# Patient Record
Sex: Female | Born: 1990 | Race: Black or African American | Hispanic: No | Marital: Single
Health system: Southern US, Community
[De-identification: ages and names within clinical notes are randomized; demographics above are authoritative.]

## PROBLEM LIST (undated history)

## (undated) HISTORY — PX: INCISION AND DRAINAGE OF WOUND: SHX1803

---
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
## 2010-10-22 NOTE — Op Note (Signed)
NAME:  Baik, Jerolyn             ACCOUNT NO.:  [id number]   MEDICAL RECORD NO.:  [id number]          PATIENT TYPE:  AMB   LOCATION:  DSC                          FACILITY:  [facility]   PHYSICIAN:  Prabhakar D. Pendse, M.D.DATE OF BIRTH:  03-May-1991   DATE OF PROCEDURE:  03/05/2004  DATE OF DISCHARGE:                                 OPERATIVE REPORT   PREOPERATIVE DIAGNOSES:  1.  Abscess, left abdominal wall.  2.  Two boils of left axilla.   POSTOPERATIVE DIAGNOSES:  1.  Abscess, left abdominal wall.  2.  Two boils of left axilla.   OPERATION PERFORMED:  Three infected lesions, #1 -- left abdominal wall and  #2 and #3 -- left axilla.   SURGEON:  Prabhakar D. Pendse, M.D.   ANESTHESIA:  Nurse.   OPERATIVE PROCEDURE:  Under satisfactory general anesthesia, patient in  supine position, left abdominal wall and left axilla regions were thoroughly  prepped and draped in the usual manner.  __________  11 blade, left axilla  boils were opened, drained, irrigated and dressed with Neosporin.  Left  abdominal wall large abscess was opened with 11 blade, debrided, irrigated,  packed with Iodoform gauze, appropriate dressing applied.  Throughout the  procedure, the patient's vital signs remained stable.  The patient withstood  the procedure well and was transferred to recovery room in satisfactory  general condition.       PDP/MEDQ  D:  03/05/2004  T:  03/06/2004  Job:  [id number]   cc:   [facility]

## 2011-04-12 ENCOUNTER — Encounter: Payer: Self-pay | Admitting: *Deleted

## 2011-04-12 ENCOUNTER — Emergency Department (HOSPITAL_COMMUNITY)
Admission: EM | Admit: 2011-04-12 | Discharge: 2011-04-13 | Discharge status: Against Medical Advice | Payer: Medicaid Other | Attending: Emergency Medicine | Admitting: Emergency Medicine

## 2011-04-12 DIAGNOSIS — R059 Cough, unspecified: Secondary | ICD-10-CM | POA: Insufficient documentation

## 2011-04-12 DIAGNOSIS — R05 Cough: Secondary | ICD-10-CM | POA: Insufficient documentation

## 2011-04-12 NOTE — ED Notes (Signed)
Difficulty breathing today. She has a history of asthma and she has had a productive cough

## 2011-04-13 ENCOUNTER — Encounter (HOSPITAL_COMMUNITY): Payer: Self-pay

## 2011-04-13 ENCOUNTER — Emergency Department (HOSPITAL_COMMUNITY)
Admission: EM | Admit: 2011-04-13 | Discharge: 2011-04-13 | Disposition: A | Discharge status: Home / Self Care | Payer: Medicaid Other | Attending: Emergency Medicine | Admitting: Emergency Medicine

## 2011-04-13 DIAGNOSIS — R0602 Shortness of breath: Secondary | ICD-10-CM | POA: Insufficient documentation

## 2011-04-13 DIAGNOSIS — J45909 Unspecified asthma, uncomplicated: Secondary | ICD-10-CM | POA: Insufficient documentation

## 2011-04-13 MED ORDER — ALBUTEROL SULFATE HFA 108 (90 BASE) MCG/ACT IN AERS: 2.0000 | INHALATION_SPRAY | RESPIRATORY_TRACT | Status: DC | PRN

## 2011-04-13 NOTE — ED Notes (Signed)
Patient ambulatory to room, in no apparent distress.  Lungs CTA . Mother sts that patient had audible wheezing earlier tonight and used all of her rescue inhaler,

## 2011-04-13 NOTE — ED Provider Notes (Signed)
History     CSN: [id number] Arrival date & time: 04/13/2011  3:37 AM   First MD Initiated Contact with Patient 04/13/11 0448      Chief Complaint  Patient presents with  . Shortness of Breath    (Consider location/radiation/quality/duration/timing/severity/associated sxs/prior treatment) Patient is a 20 y.o. female presenting with shortness of breath. The history is provided by the patient and a parent.  Shortness of Breath  The current episode started today. The problem has been resolved. Associated symptoms include shortness of breath. Pertinent negatives include no chest pain, no fever and no sore throat.  pt w hx asthma felt sob/wheezy earlier. Had run out of inhaler. No sob or wheezing currently. No chest pain. Denies cough. No fever or chills. No prior admits related to asthma.   Past Medical History  Diagnosis Date  . Asthma     History reviewed. No pertinent past surgical history.  History reviewed. No pertinent family history.  History  Substance Use Topics  . Smoking status: Never Smoker   . Smokeless tobacco: Not on file  . Alcohol Use: Yes    OB History    Grav Para Term Preterm Abortions TAB SAB Ect Mult Living                  Review of Systems  Constitutional: Negative for fever and chills.  HENT: Negative for sore throat.   Respiratory: Positive for shortness of breath.   Cardiovascular: Negative for chest pain, palpitations and leg swelling.  Gastrointestinal: Negative for abdominal pain.  Skin: Negative for rash.  Neurological: Negative for headaches.    Allergies  Zyrtec  Home Medications   Current Outpatient Rx  Name Route Sig Dispense Refill  . ALBUTEROL SULFATE HFA 108 (90 BASE) MCG/ACT IN AERS Inhalation Inhale 2 puffs into the lungs every 6 (six) hours as needed. For wheezing     . IBUPROFEN 200 MG PO TABS Oral Take 200 mg by mouth every 6 (six) hours as needed. Headaches       BP 131/75  Pulse 100  Temp(Src) 97.6 F (36.4 C)  (Oral)  Resp 18  SpO2 100%  LMP 04/04/2011  Physical Exam  Nursing note and vitals reviewed. Constitutional: She is oriented to person, place, and time. She appears well-developed and well-nourished. No distress.  HENT:  Nose: Nose normal.  Mouth/Throat: Oropharynx is clear and moist.  Eyes: Conjunctivae are normal. No scleral icterus.  Neck: Neck supple. No tracheal deviation present.  Cardiovascular: Normal rate, regular rhythm, normal heart sounds and intact distal pulses.  Exam reveals no friction rub.   No murmur heard. Pulmonary/Chest: Effort normal and breath sounds normal. No respiratory distress. She has no wheezes. She has no rales.  Abdominal: Normal appearance.  Musculoskeletal: She exhibits no edema and no tenderness.  Neurological: She is alert and oriented to person, place, and time.  Skin: Skin is warm and dry. No rash noted.  Psychiatric: She has a normal mood and affect.    ED Course  Procedures (including critical care time)  Labs Reviewed - No data to display No results found.   No diagnosis found.    MDM  Pt denies any c/o currently x states is hungry. Will give rx for alb mdi.        Suzi Roots, MD 04/13/11 [phone]

## 2011-04-13 NOTE — ED Notes (Signed)
Mom states that pt has been complaining of a headache and was having trouble breathing earlier tonight, pt is out of her inhaler

## 2011-10-22 ENCOUNTER — Encounter (HOSPITAL_COMMUNITY): Payer: Self-pay | Admitting: Emergency Medicine

## 2011-10-22 ENCOUNTER — Emergency Department (HOSPITAL_COMMUNITY)
Admission: EM | Admit: 2011-10-22 | Discharge: 2011-10-22 | Disposition: A | Discharge status: Home / Self Care | Payer: Medicaid Other | Attending: Emergency Medicine | Admitting: Emergency Medicine

## 2011-10-22 DIAGNOSIS — R0609 Other forms of dyspnea: Secondary | ICD-10-CM | POA: Insufficient documentation

## 2011-10-22 DIAGNOSIS — R0989 Other specified symptoms and signs involving the circulatory and respiratory systems: Secondary | ICD-10-CM | POA: Insufficient documentation

## 2011-10-22 DIAGNOSIS — J45901 Unspecified asthma with (acute) exacerbation: Secondary | ICD-10-CM | POA: Insufficient documentation

## 2011-10-22 MED ORDER — PREDNISONE 50 MG PO TABS: 50.0000 mg | ORAL_TABLET | Freq: Every day | ORAL | Status: AC

## 2011-10-22 MED ORDER — PREDNISONE 50 MG PO TABS: 50.0000 mg | ORAL_TABLET | ORAL | Status: DC

## 2011-10-22 MED ORDER — ALBUTEROL SULFATE (5 MG/ML) 0.5% IN NEBU
5.0000 mg | INHALATION_SOLUTION | Freq: Once | RESPIRATORY_TRACT | Status: AC
  Administered 2011-10-22: 5 mg via RESPIRATORY_TRACT
  Filled 2011-10-22: qty 1

## 2011-10-22 MED ORDER — ALBUTEROL SULFATE (5 MG/ML) 0.5% IN NEBU
5.0000 mg | INHALATION_SOLUTION | Freq: Once | RESPIRATORY_TRACT | Status: AC
  Administered 2011-10-22: 5 mg via RESPIRATORY_TRACT

## 2011-10-22 MED ORDER — ALBUTEROL SULFATE HFA 108 (90 BASE) MCG/ACT IN AERS
2.0000 | INHALATION_SPRAY | Freq: Four times a day (QID) | RESPIRATORY_TRACT | Status: DC
  Administered 2011-10-22: 2 via RESPIRATORY_TRACT
  Filled 2011-10-22: qty 6.7

## 2011-10-22 MED ORDER — ALBUTEROL SULFATE (5 MG/ML) 0.5% IN NEBU
INHALATION_SOLUTION | RESPIRATORY_TRACT | Status: AC
  Administered 2011-10-22: 5 mg via RESPIRATORY_TRACT
  Filled 2011-10-22: qty 1

## 2011-10-22 MED ORDER — PREDNISONE 20 MG PO TABS
50.0000 mg | ORAL_TABLET | ORAL | Status: AC
  Administered 2011-10-22: 50 mg via ORAL
  Filled 2011-10-22: qty 3

## 2011-10-22 NOTE — ED Provider Notes (Signed)
History    This chart was scribed for Gerhard Munch, MD, MD by Smitty Pluck. The patient was seen in room STRE1 and the patient's care was started at 5:06PM.   CSN: [id number]  Arrival date & time 10/22/11  1630   None     Chief Complaint  Patient presents with  . Asthma    (Consider location/radiation/quality/duration/timing/severity/associated sxs/prior treatment) The history is provided by the patient.   Jillian Ramsey is a 21 y.o. female who presents to the Emergency Department complaining of moderate asthma onset today. Pt reports that she was wheezing and having difficulty breathing. She reports that she is out medication for asthma. She reports that she has seasonal allergies. Denies any other pain. Denies hx of medical illness. Denies vomiting, diarrhea, fever and cough.   Past Medical History  Diagnosis Date  . Asthma     History reviewed. No pertinent past surgical history.  History reviewed. No pertinent family history.  History  Substance Use Topics  . Smoking status: Never Smoker   . Smokeless tobacco: Not on file  . Alcohol Use: Yes    OB History    Grav Para Term Preterm Abortions TAB SAB Ect Mult Living                  Review of Systems  Constitutional:       HPI  HENT:       HPI otherwise negative  Eyes: Negative.   Respiratory:       HPI, otherwise negative  Cardiovascular:       HPI, otherwise nmegative  Gastrointestinal: Negative for vomiting.  Genitourinary:       HPI, otherwise negative  Musculoskeletal:       HPI, otherwise negative  Skin: Negative.   Neurological: Negative for syncope.    Allergies  Zyrtec  Home Medications   Current Outpatient Rx  Name Route Sig Dispense Refill  . ALBUTEROL SULFATE HFA 108 (90 BASE) MCG/ACT IN AERS Inhalation Inhale 2 puffs into the lungs every 6 (six) hours as needed. For wheezing     . IBUPROFEN 200 MG PO TABS Oral Take 200 mg by mouth every 6 (six) hours as needed. Headaches        BP 153/92  Pulse 113  Temp(Src) 98.3 F (36.8 C) (Oral)  Resp 28  SpO2 100%  LMP 10/18/2011  Physical Exam  Nursing note and vitals reviewed. Constitutional: She is oriented to person, place, and time. She appears well-developed and well-nourished. No distress.  HENT:  Head: Normocephalic and atraumatic.  Eyes: EOM are normal. Pupils are equal, round, and reactive to light.  Neck: Neck supple. No tracheal deviation present.  Cardiovascular: Normal rate.   Pulmonary/Chest: Effort normal. No respiratory distress.  Musculoskeletal: Normal range of motion.  Neurological: She is alert and oriented to person, place, and time.  Skin: Skin is warm and dry.  Psychiatric: She has a normal mood and affect. Her behavior is normal.    ED Course  Procedures (including critical care time) DIAGNOSTIC STUDIES: Oxygen Saturation is 100% on room air, normal by my interpretation.    COORDINATION OF CARE: 5:08PM EDP discusses pt ED treatment with pt.  5:30PM EDP ordered medication: albuterol, proventil, deltasone   Labs Reviewed - No data to display No results found.   No diagnosis found.    MDM  I personally performed the services described in this documentation, which was scribed in my presence. The recorded information has been reviewed and  considered.  This young female with history of asthma now presents with concerns of dyspnea.  By the time of my exam she has already received one albuterol treatment.  Patient is in no distress with minimal wheezing audible on exam.  Patient had no other concerning signs of overt infection, or impending decompensation.  She was provided an albuterol inhaler, and started on a short course of steroids.  Return precautions, follow up instructions were provided.    Gerhard Munch, MD 10/22/11 [phone]

## 2011-10-22 NOTE — ED Notes (Signed)
Patient complaining of asthma attack and difficulty breathing; symptoms started today.  Patient reports being out of medications for her asthma.  To start albuterol treatment in triage.  Breaths equal, but shallow.

## 2011-10-22 NOTE — ED Notes (Signed)
Patient finished with albuterol treatment; states, "I feel a little better".  Will continue to monitor.

## 2011-10-22 NOTE — Discharge Instructions (Signed)
Asthma Attack Prevention HOW CAN ASTHMA BE PREVENTED? Currently, there is no way to prevent asthma from starting. However, you can take steps to control the disease and prevent its symptoms after you have been diagnosed. Learn about your asthma and how to control it. Take an active role to control your asthma by working with your caregiver to create and follow an asthma action plan. An asthma action plan guides you in taking your medicines properly, avoiding factors that make your asthma worse, tracking your level of asthma control, responding to worsening asthma, and seeking emergency care when needed. To track your asthma, keep records of your symptoms, check your peak flow number using a peak flow meter (handheld device that shows how well air moves out of your lungs), and get regular asthma checkups.  Other ways to prevent asthma attacks include:  Use medicines as your caregiver directs.   Identify and avoid things that make your asthma worse (as much as you can).   Keep track of your asthma symptoms and level of control.   Get regular checkups for your asthma.   With your caregiver, write a detailed plan for taking medicines and managing an asthma attack. Then be sure to follow your action plan. Asthma is an ongoing condition that needs regular monitoring and treatment.   Identify and avoid asthma triggers. A number of outdoor allergens and irritants (pollen, mold, cold air, air pollution) can trigger asthma attacks. Find out what causes or makes your asthma worse, and take steps to avoid those triggers (see below).   Monitor your breathing. Learn to recognize warning signs of an attack, such as slight coughing, wheezing or shortness of breath. However, your lung function may already decrease before you notice any signs or symptoms, so regularly measure and record your peak airflow with a home peak flow meter.   Identify and treat attacks early. If you act quickly, you're less likely to have  a severe attack. You will also need less medicine to control your symptoms. When your peak flow measurements decrease and alert you to an upcoming attack, take your medicine as instructed, and immediately stop any activity that may have triggered the attack. If your symptoms do not improve, get medical help.   Pay attention to increasing quick-relief inhaler use. If you find yourself relying on your quick-relief inhaler (such as albuterol), your asthma is not under control. See your caregiver about adjusting your treatment.  IDENTIFY AND CONTROL FACTORS THAT MAKE YOUR ASTHMA WORSE A number of common things can set off or make your asthma symptoms worse (asthma triggers). Keep track of your asthma symptoms for several weeks, detailing all the environmental and emotional factors that are linked with your asthma. When you have an asthma attack, go back to your asthma diary to see which factor, or combination of factors, might have contributed to it. Once you know what these factors are, you can take steps to control many of them.  Allergies: If you have allergies and asthma, it is important to take asthma prevention steps at home. Asthma attacks (worsening of asthma symptoms) can be triggered by allergies, which can cause temporary increased inflammation of your airways. Minimizing contact with the substance to which you are allergic will help prevent an asthma attack. Animal Dander:   Some people are allergic to the flakes of skin or dried saliva from animals with fur or feathers. Keep these pets out of your home.   If you can't keep a pet outdoors, keep the   pet out of your bedroom and other sleeping areas at all times, and keep the door closed.   Remove carpets and furniture covered with cloth from your home. If that is not possible, keep the pet away from fabric-covered furniture and carpets.  Dust Mites:  Many people with asthma are allergic to dust mites. Dust mites are tiny bugs that are found in  every home, in mattresses, pillows, carpets, fabric-covered furniture, bedcovers, clothes, stuffed toys, fabric, and other fabric-covered items.   Cover your mattress in a special dust-proof cover.   Cover your pillow in a special dust-proof cover, or wash the pillow each week in hot water. Water must be hotter than 130 F to kill dust mites. Cold or warm water used with detergent and bleach can also be effective.   Wash the sheets and blankets on your bed each week in hot water.   Try not to sleep or lie on cloth-covered cushions.   Call ahead when traveling and ask for a smoke-free hotel room. Bring your own bedding and pillows, in case the hotel only supplies feather pillows and down comforters, which may contain dust mites and cause asthma symptoms.   Remove carpets from your bedroom and those laid on concrete, if you can.   Keep stuffed toys out of the bed, or wash the toys weekly in hot water or cooler water with detergent and bleach.  Cockroaches:  Many people with asthma are allergic to the droppings and remains of cockroaches.   Keep food and garbage in closed containers. Never leave food out.   Use poison baits, traps, powders, gels, or paste (for example, boric acid).   If a spray is used to kill cockroaches, stay out of the room until the odor goes away.  Indoor Mold:  Fix leaky faucets, pipes, or other sources of water that have mold around them.   Clean moldy surfaces with a cleaner that has bleach in it.  Pollen and Outdoor Mold:  When pollen or mold spore counts are high, try to keep your windows closed.   Stay indoors with windows closed from late morning to afternoon, if you can. Pollen and some mold spore counts are highest at that time.   Ask your caregiver whether you need to take or increase anti-inflammatory medicine before your allergy season starts.  Irritants:   Tobacco smoke is an irritant. If you smoke, ask your caregiver how you can quit. Ask family  members to quit smoking, too. Do not allow smoking in your home or car.   If possible, do not use a wood-burning stove, kerosene heater, or fireplace. Minimize exposure to all sources of smoke, including incense, candles, fires, and fireworks.   Try to stay away from strong odors and sprays, such as perfume, talcum powder, hair spray, and paints.   Decrease humidity in your home and use an indoor air cleaning device. Reduce indoor humidity to below 60 percent. Dehumidifiers or central air conditioners can do this.   Try to have someone else vacuum for you once or twice a week, if you can. Stay out of rooms while they are being vacuumed and for a short while afterward.   If you vacuum, use a dust mask from a hardware store, a double-layered or microfilter vacuum cleaner bag, or a vacuum cleaner with a HEPA filter.   Sulfites in foods and beverages can be irritants. Do not drink beer or wine, or eat dried fruit, processed potatoes, or shrimp if they cause asthma   symptoms.   Cold air can trigger an asthma attack. Cover your nose and mouth with a scarf on cold or windy days.   Several health conditions can make asthma more difficult to manage, including runny nose, sinus infections, reflux disease, psychological stress, and sleep apnea. Your caregiver will treat these conditions, as well.   Avoid close contact with people who have a cold or the flu, since your asthma symptoms may get worse if you catch the infection from them. Wash your hands thoroughly after touching items that may have been handled by people with a respiratory infection.   Get a flu shot every year to protect against the flu virus, which often makes asthma worse for days or weeks. Also get a pneumonia shot once every five to 10 years.  Drugs:  Aspirin and other painkillers can cause asthma attacks. 10% to 20% of people with asthma have sensitivity to aspirin or a group of painkillers called non-steroidal anti-inflammatory drugs  (NSAIDS), such as ibuprofen and naproxen. These drugs are used to treat pain and reduce fevers. Asthma attacks caused by any of these medicines can be severe and even fatal. These drugs must be avoided in people who have known aspirin sensitive asthma. Products with acetaminophen are considered safe for people who have asthma. It is important that people with aspirin sensitivity read labels of all over-the-counter drugs used to treat pain, colds, coughs, and fever.   Beta blockers and ACE inhibitors are other drugs which you should discuss with your caregiver, in relation to your asthma.  ALLERGY SKIN TESTING  Ask your asthma caregiver about allergy skin testing or blood testing (RAST test) to identify the allergens to which you are sensitive. If you are found to have allergies, allergy shots (immunotherapy) for asthma may help prevent future allergies and asthma. With allergy shots, small doses of allergens (substances to which you are allergic) are injected under your skin on a regular schedule. Over a period of time, your body may become used to the allergen and less responsive with asthma symptoms. You can also take measures to minimize your exposure to those allergens. EXERCISE  If you have exercise-induced asthma, or are planning vigorous exercise, or exercise in cold, humid, or dry environments, prevent exercise-induced asthma by following your caregiver's advice regarding asthma treatment before exercising. Document Released: 05/11/2009 Document Revised: 05/12/2011 Document Reviewed: 05/11/2009 [employer] Patient Information 2012 [employer]. 

## 2011-10-28 ENCOUNTER — Ambulatory Visit (INDEPENDENT_AMBULATORY_CARE_PROVIDER_SITE_OTHER): Payer: Medicaid Other | Admitting: Family Medicine

## 2011-10-28 ENCOUNTER — Encounter: Payer: Self-pay | Admitting: Family Medicine

## 2011-10-28 VITALS — BP 122/84 | HR 72 | Temp 97.3°F | Ht 62.0 in | Wt 87.5 lb

## 2011-10-28 DIAGNOSIS — IMO0002 Reserved for concepts with insufficient information to code with codable children: Secondary | ICD-10-CM

## 2011-10-28 DIAGNOSIS — J4520 Mild intermittent asthma, uncomplicated: Secondary | ICD-10-CM

## 2011-10-28 DIAGNOSIS — J45909 Unspecified asthma, uncomplicated: Secondary | ICD-10-CM

## 2011-10-28 MED ORDER — ALBUTEROL SULFATE HFA 108 (90 BASE) MCG/ACT IN AERS: 2.0000 | INHALATION_SPRAY | Freq: Four times a day (QID) | RESPIRATORY_TRACT | Status: DC | PRN

## 2011-10-28 MED ORDER — BECLOMETHASONE DIPROPIONATE 40 MCG/ACT IN AERS: 1.0000 | INHALATION_SPRAY | Freq: Two times a day (BID) | RESPIRATORY_TRACT | Status: DC

## 2011-10-28 NOTE — Patient Instructions (Signed)
Nice to meet you. You should use QVAR twice daily every day. You should use albuterol inhaler only when needed for shortness of breath or wheezing.  Asthma Prevention Cigarette smoke, house dust, molds, pollens, animal dander, certain insects, exercise, and even cold air are all triggers that can cause an asthma attack. Often, no specific triggers are identified.  Take the following measures around your house to reduce attacks:  Avoid cigarette and other smoke. No smoking should be allowed in a home where someone with asthma lives. If smoking is allowed indoors, it should be done in a room with a closed door, and a window should be opened to clear the air. If possible, do not use a wood-burning stove, kerosene heater, or fireplace. Minimize exposure to all sources of smoke, including incense, candles, fires, and fireworks.   Decrease pollen exposure. Keep your windows shut and use central air during the pollen allergy season. Stay indoors with windows closed from late morning to afternoon, if you can. Avoid mowing the lawn if you have grass pollen allergy. Change your clothes and shower after being outside during this time of year.   Remove molds from bathrooms and wet areas. Do this by cleaning the floors with a fungicide or diluted bleach. Avoid using humidifiers, vaporizers, or swamp coolers. These can spread molds through the air. Fix leaky faucets, pipes, or other sources of water that have mold around them.   Decrease house dust exposure. Do this by using bare floors, vacuuming frequently, and changing furnace and air cooler filters frequently. Avoid using feather, wool, or foam bedding. Use polyester pillows and plastic covers over your mattress. Wash bedding weekly in hot water (hotter than 130 F).   Try to get someone else to vacuum for you once or twice a week, if you can. Stay out of rooms while they are being vacuumed and for a short while afterward. If you vacuum, use a dust mask (from  a hardware store), a double-layered or microfilter vacuum cleaner bag, or a vacuum cleaner with a HEPA filter.   Avoid perfumes, talcum powder, hair spray, paints and other strong odors and fumes.   Keep warm-blooded pets (cats, dogs, rodents, birds) outside the home if they are triggers for asthma. If you can't keep the pet outdoors, keep the pet out of your bedroom and other sleeping areas at all times, and keep the door closed. Remove carpets and furniture covered with cloth from your home. If that is not possible, keep the pet away from fabric-covered furniture and carpets.   Eliminate cockroaches. Keep food and garbage in closed containers. Never leave food out. Use poison baits, traps, powders, gels, or paste (for example, boric acid). If a spray is used to kill cockroaches, stay out of the room until the odor goes away.   Decrease indoor humidity to less than 60%. Use an indoor air cleaning device.   Avoid sulfites in foods and beverages. Do not drink beer or wine or eat dried fruit, processed potatoes, or shrimp if they cause asthma symptoms.   Avoid cold air. Cover your nose and mouth with a scarf on cold or windy days.   Avoid aspirin. This is the most common drug causing serious asthma attacks.   If exercise triggers your asthma, ask your caregiver how you should prepare before exercising. (For example, ask if you could use your inhaler 10 minutes before exercising.)   Avoid close contact with people who have a cold or the flu since your asthma  symptoms may get worse if you catch the infection from them. Wash your hands thoroughly after touching items that may have been handled by others with a respiratory infection.   Get a flu shot every year to protect against the flu virus, which often makes asthma worse for days to weeks. Also get a pneumonia shot once every five to 10 years.  Call your caregiver if you want further information about measures you can take to help prevent asthma  attacks. Document Released: 05/23/2005 Document Revised: 05/12/2011 Document Reviewed: 03/31/2009 [facility] Patient Information 2012 [place], [place].

## 2011-10-28 NOTE — Progress Notes (Signed)
  Subjective:    Patient ID: Jillian Ramsey, female    DOB: 1991/03/28, 20 y.o.   MRN: [id number]  HPI new patient to establish care.  1. asthma attack. Patient was seen in the ER one week ago and treated for asthma attack with 5 days of prednisone and albuterol. She was discharged from the hospital. At that time her wheezing and shortness of breath have improved. She continues to use albuterol 1-2 times daily.  Has a history of asthma as a child requiring one hospitalization, no history of intubations. Patient states she has been asymptomatic for some time until her most recent exacerbation. Mother smokes outside home. Denies current wheezing, dyspnea, cough, fever, palpitations, rash, visual changes.  2. health maintenance. Patient absent and not interested in contraception. Has regular menstrual cycles. Does exercise and eats very diet.  Past Medical History  Diagnosis Date  . Asthma     Past Surgical History  Procedure Date  . Incision and drainage of wound    History   Social History  . Marital Status: Single    Spouse Name: N/A    Number of Children: N/A  . Years of Education: N/A   Occupational History  . Not on file.   Social History Main Topics  . Smoking status: Never Smoker   . Smokeless tobacco: Not on file  . Alcohol Use: Yes  . Drug Use: No  . Sexually Active: No   Other Topics Concern  . Not on file   Social History Narrative   Lives with mother Jillian Ramsey, Has one grown brother. High school graduate. Unemployed.   Family History  Problem Relation Age of Onset  . Hyperlipidemia Mother   . Hypertension Mother   . Asthma Brother   . Cancer Maternal Grandmother 49    pancreatic  . Diabetes Maternal Grandmother   . Heart disease Maternal Grandfather   . Stroke Maternal Grandfather    Review of Systems See history of present illness otherwise negative.    Objective:   Physical Exam  Vitals reviewed. Constitutional: She is oriented to person,  place, and time. She appears well-developed and well-nourished. No distress.  HENT:  Head: Normocephalic and atraumatic.  Mouth/Throat: Oropharynx is clear and moist.  Eyes: EOM are normal. Pupils are equal, round, and reactive to light.  Neck: Neck supple.  Cardiovascular: Normal rate, regular rhythm, normal heart sounds and intact distal pulses.   No murmur heard. Pulmonary/Chest: Effort normal and breath sounds normal. No respiratory distress. She has no wheezes. She has no rales.  Abdominal: Soft.  Musculoskeletal: Normal range of motion. She exhibits no edema and no tenderness.  Neurological: She is alert and oriented to person, place, and time. No cranial nerve deficit. She exhibits normal muscle tone. Coordination normal.  Skin: No rash noted. She is not diaphoretic.  Psychiatric: She has a normal mood and affect.       Assessment & Plan:

## 2011-10-28 NOTE — Assessment & Plan Note (Signed)
Patient is currently requiring albuterol treatments one to 2 times daily even after resolution of symptoms. Her brother has history of severe persistent asthma. Will start patient on low-dose Qvar. Have discussed with patient and her mother the difference between rescue inhaler and maintenance therapy, and I believe they do understand this. I have advised patient and mother to followup if still requiring albuterol more than once weekly. Patient may likely not require inhaled steroid in long-term his symptoms resolve we'll get trial without this. Discussed avoidance of triggers including secondhand smoke.

## 2012-05-07 ENCOUNTER — Telehealth: Payer: Self-pay | Admitting: Family Medicine

## 2012-05-07 NOTE — Telephone Encounter (Signed)
Please notify they need an office visit, looks like has been > 1 year since I have seen her.

## 2012-05-07 NOTE — Telephone Encounter (Signed)
Mother is calling, very confusing, asking for a letter from Dr. Cristal Ford for Franc[place] to be able to receive different services through Medicaid to assist her daughter with being able to function in the real world.

## 2012-05-14 NOTE — Telephone Encounter (Signed)
Please call family to schedule an office visit.

## 2012-06-13 ENCOUNTER — Ambulatory Visit (INDEPENDENT_AMBULATORY_CARE_PROVIDER_SITE_OTHER): Payer: Medicaid Other | Admitting: Family Medicine

## 2012-06-13 ENCOUNTER — Encounter: Payer: Self-pay | Admitting: Family Medicine

## 2012-06-13 ENCOUNTER — Telehealth: Payer: Self-pay | Admitting: Family Medicine

## 2012-06-13 VITALS — BP 110/62 | HR 57 | Temp 98.5°F | Wt 92.7 lb

## 2012-06-13 DIAGNOSIS — J45909 Unspecified asthma, uncomplicated: Secondary | ICD-10-CM

## 2012-06-13 DIAGNOSIS — IMO0002 Reserved for concepts with insufficient information to code with codable children: Secondary | ICD-10-CM | POA: Insufficient documentation

## 2012-06-13 DIAGNOSIS — R4689 Other symptoms and signs involving appearance and behavior: Secondary | ICD-10-CM | POA: Insufficient documentation

## 2012-06-13 DIAGNOSIS — R636 Underweight: Secondary | ICD-10-CM

## 2012-06-13 DIAGNOSIS — F09 Unspecified mental disorder due to known physiological condition: Secondary | ICD-10-CM

## 2012-06-13 DIAGNOSIS — R4189 Other symptoms and signs involving cognitive functions and awareness: Secondary | ICD-10-CM | POA: Insufficient documentation

## 2012-06-13 DIAGNOSIS — J4530 Mild persistent asthma, uncomplicated: Secondary | ICD-10-CM

## 2012-06-13 LAB — CBC WITH DIFFERENTIAL/PLATELET
Eosinophils Absolute: 0.2 10*3/uL (ref 0.0–0.7)
Eosinophils Relative: 3 % (ref 0–5)
HCT: 39 % (ref 36.0–46.0)
Lymphocytes Relative: 32 % (ref 12–46)
Lymphs Abs: 2.3 10*3/uL (ref 0.7–4.0)
MCH: 28.3 pg (ref 26.0–34.0)
MCV: 86.1 fL (ref 78.0–100.0)
Monocytes Absolute: 0.5 10*3/uL (ref 0.1–1.0)
Platelets: 392 10*3/uL (ref 150–400)
RBC: 4.53 MIL/uL (ref 3.87–5.11)
RDW: 13.3 % (ref 11.5–15.5)
WBC: 7.2 10*3/uL (ref 4.0–10.5)

## 2012-06-13 LAB — TSH: TSH: 1.103 u[IU]/mL (ref 0.350–4.500)

## 2012-06-13 LAB — COMPREHENSIVE METABOLIC PANEL
BUN: 10 mg/dL (ref 6–23)
CO2: 27 mEq/L (ref 19–32)
Calcium: 10.3 mg/dL (ref 8.4–10.5)
Chloride: 103 mEq/L (ref 96–112)
Creat: 0.6 mg/dL (ref 0.50–1.10)
Total Bilirubin: 0.7 mg/dL (ref 0.3–1.2)

## 2012-06-13 NOTE — Assessment & Plan Note (Addendum)
Stable, has gained 5 lbs since last year. Good appetite. 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 Check TSH, basic labs to evaluate nutrition status.

## 2012-06-13 NOTE — Patient Instructions (Addendum)
Nice to see you. If your asthma gets worse, come back to clinic. Try to avoid smoking. Eat healthy foods.

## 2012-06-13 NOTE — Telephone Encounter (Signed)
Left message. Called mother to get an idea about what kind of letter she was requesting earlier. I saw Ambera and Karel Jarvis today (transported by [facility] with their day program) and wanted to make sure their needs were met.

## 2012-06-13 NOTE — Assessment & Plan Note (Signed)
Symptoms well controlled. Advised to avoid smoking. Patient refuses flu shot today.

## 2012-06-13 NOTE — Assessment & Plan Note (Signed)
Attempted to call mother to make sure needs being met at Day program ([facility]?). Left message to return call.

## 2012-06-13 NOTE — Telephone Encounter (Signed)
Discussed with mother. She requests letters of medical benefit of there therapies. I will send these to her by mail.

## 2012-06-13 NOTE — Progress Notes (Signed)
  Subjective:    Patient ID: Jillian Ramsey, female    DOB: [birth year], 22 y.o.   MRN: [id number]  HPI pt and her sister transported by [facility] with [facility].   1. Routine f/u asthma. No complaints today. She reports using albuterol inhaler prn on a monthly basis, none recently. No ED or UC visits. States she smokes infrequently, but she gets in trouble and not regular.  Denies dyspnea, cough, fever, wheezing, chest pain, abd pain.   2. Low weight. Good appetite. Stable. Has regular menstrual cycles. Denies pain, fever, diarrhea, severe cramping, chills, night sweats.   Review of Systems See HPI otherwise negative.  reports that she has been smoking Cigarettes.  She has been smoking about .1 packs per day. She does not have any smokeless tobacco history on file.     Objective:   Physical Exam  Constitutional: She is oriented to person, place, and time. She appears well-developed and well-nourished. No distress.       Thin. Smiling appears well.  HENT:  Head: Normocephalic and atraumatic.  Mouth/Throat: No oropharyngeal exudate.  Eyes: EOM are normal.  Neck: Normal range of motion. Neck supple. No thyromegaly present.  Cardiovascular: Normal rate, regular rhythm and normal heart sounds.   No murmur heard. Pulmonary/Chest: Effort normal and breath sounds normal. No respiratory distress. She has no wheezes. She has no rales.  Abdominal: Soft. Bowel sounds are normal. She exhibits no distension. There is no tenderness.  Musculoskeletal: She exhibits no edema.  Neurological: She is alert and oriented to person, place, and time.  Skin: No rash noted. She is not diaphoretic.  Psychiatric: She has a normal mood and affect.       Assessment & Plan:

## 2012-12-07 ENCOUNTER — Encounter (HOSPITAL_COMMUNITY): Payer: Self-pay | Admitting: Emergency Medicine

## 2012-12-07 ENCOUNTER — Emergency Department (HOSPITAL_COMMUNITY)
Admission: EM | Admit: 2012-12-07 | Discharge: 2012-12-07 | Disposition: A | Discharge status: Home / Self Care | Payer: Medicaid Other | Attending: Emergency Medicine | Admitting: Emergency Medicine

## 2012-12-07 DIAGNOSIS — J45909 Unspecified asthma, uncomplicated: Secondary | ICD-10-CM | POA: Insufficient documentation

## 2012-12-07 DIAGNOSIS — F17200 Nicotine dependence, unspecified, uncomplicated: Secondary | ICD-10-CM | POA: Insufficient documentation

## 2012-12-07 DIAGNOSIS — H5789 Other specified disorders of eye and adnexa: Secondary | ICD-10-CM

## 2012-12-07 DIAGNOSIS — H579 Unspecified disorder of eye and adnexa: Secondary | ICD-10-CM | POA: Insufficient documentation

## 2012-12-07 DIAGNOSIS — Z79899 Other long term (current) drug therapy: Secondary | ICD-10-CM | POA: Insufficient documentation

## 2012-12-07 MED ORDER — FLUORESCEIN SODIUM 1 MG OP STRP
1.0000 | ORAL_STRIP | Freq: Once | OPHTHALMIC | Status: AC
  Administered 2012-12-07: 1 via OPHTHALMIC
  Filled 2012-12-07: qty 1

## 2012-12-07 MED ORDER — TETRACAINE HCL 0.5 % OP SOLN
1.0000 [drp] | Freq: Once | OPHTHALMIC | Status: AC
  Administered 2012-12-07: 1 [drp] via OPHTHALMIC
  Filled 2012-12-07: qty 2

## 2012-12-07 NOTE — ED Notes (Signed)
Woke this am with eye irritation, redness and swelling. No known injury.

## 2012-12-07 NOTE — ED Provider Notes (Signed)
History    CSN: [id number] Arrival date & time 12/07/12  0912  First MD Initiated Contact with Patient 12/07/12 0920     Chief Complaint  Patient presents with  . Eye Problem   (Consider location/radiation/quality/duration/timing/severity/associated sxs/prior Treatment) HPI Pt is a 22yo female BIB mother c/o right eye swelling, redness, and itching when she woke up this morning.  Pt states right eye was swollen shut and very itchy.  Mother states right eye was draining clear fluid and swollen.  Pt denies any pain. Denies injury to the eye. Pt does wear glasses but never wears contacts.  Denies ear pain, nasal congestion or drainage, denies fever.  Pt does have regular eye exams.   Past Medical History  Diagnosis Date  . Asthma    Past Surgical History  Procedure Laterality Date  . Incision and drainage of wound     Family History  Problem Relation Age of Onset  . Hyperlipidemia Mother   . Hypertension Mother   . Asthma Brother   . Cancer Maternal Grandmother 49    pancreatic  . Diabetes Maternal Grandmother   . Heart disease Maternal Grandfather   . Stroke Maternal Grandfather    History  Substance Use Topics  . Smoking status: Current Some Day Smoker -- 0.10 packs/day    Types: Cigarettes  . Smokeless tobacco: Not on file  . Alcohol Use: Yes   OB History   Grav Para Term Preterm Abortions TAB SAB Ect Mult Living                 Review of Systems  Constitutional: Negative for fever and chills.  Eyes: Positive for discharge ( clear), redness and itching. Negative for photophobia, pain and visual disturbance.       Right Eye   All other systems reviewed and are negative.    Allergies  Zyrtec  Home Medications   Current Outpatient Rx  Name  Route  Sig  Dispense  Refill  . EXPIRED: albuterol (PROVENTIL HFA) 108 (90 BASE) MCG/ACT inhaler   Inhalation   Inhale 2 puffs into the lungs every 6 (six) hours as needed for wheezing.   1 Inhaler   1   . EXPIRED:  beclomethasone (QVAR) 40 MCG/ACT inhaler   Inhalation   Inhale 1 puff into the lungs 2 (two) times daily.   1 Inhaler   3   . ibuprofen (ADVIL,MOTRIN) 200 MG tablet   Oral   Take 200 mg by mouth every 6 (six) hours as needed. Headaches           BP 121/71  Pulse 72  Temp(Src) 98.1 F (36.7 C) (Oral)  SpO2 100%  LMP 12/01/2012 Physical Exam  Nursing note and vitals reviewed. Constitutional: She appears well-developed and well-nourished. No distress.  Pt appears well, NAD. No obvious deformity or irritation in eyes.   HENT:  Head: Normocephalic and atraumatic.  Right Ear: Hearing, tympanic membrane, external ear and ear canal normal.  Left Ear: Hearing, tympanic membrane, external ear and ear canal normal.  Nose: Nose normal.  Mouth/Throat: Uvula is midline and oropharynx is clear and moist. No oropharyngeal exudate.  Eyes: EOM and lids are normal. Pupils are equal, round, and reactive to light. No foreign bodies found. Right eye exhibits no chemosis, no discharge, no exudate and no hordeolum. No foreign body present in the right eye. Left eye exhibits no chemosis, no discharge, no exudate and no hordeolum. No foreign body present in the left eye. Right  conjunctiva is injected ( mild). Right conjunctiva has no hemorrhage. Left conjunctiva is not injected. Left conjunctiva has no hemorrhage. No scleral icterus. Right eye exhibits normal extraocular motion and no nystagmus. Left eye exhibits normal extraocular motion and no nystagmus.  Slit lamp exam:      The right eye shows no corneal abrasion, no corneal flare, no corneal ulcer, no foreign body, no hyphema, no hypopyon and no fluorescein uptake.    Visual acuity: R-20/30, L-20/30, Bilat-20/-30 with corrective lenses. Mild erythema of right eye.  PERRL. EOM in tact.  Nl slit lamp exam. Fluorescein used. No corneal abrassion or corneal ulcer.   Neck: Normal range of motion. Neck supple.  Cardiovascular: Normal rate, regular rhythm  and normal heart sounds.   Pulmonary/Chest: Effort normal. No respiratory distress. She has no wheezes.  Musculoskeletal: Normal range of motion.  Neurological: She is alert.  Skin: Skin is warm and dry. She is not diaphoretic.  Psychiatric: She has a normal mood and affect. Her behavior is normal.    ED Course  Procedures (including critical care time) Labs Reviewed - No data to display No results found. 1. Irritation of right eye     MDM  R eye irritation, "swollen" per pt.  Right eye appears mildly injected/erythemic. Rest of eye exam NL.  No corneal abrasion or ulcers. Visual acuity obtained, same in both eyes with glasses. Pt may use warm and cool compresses for irritation.  Advised not to continuously rub eye, may make irritation last longer or even worse.  Advised pt to f/u with Dr. Allyne Gee or with her previously established opthalmologist for continued eye irritation. Advised to return if notice increase swelling, pain, crusted thick discharge, fever, or other worrisome symptoms arise. Pt and mother verbalized understanding and agreement with tx plan.  Vitals: unremarkable. Discharged in stable condition.      Junius Finner, PA-C 12/07/12 1010

## 2012-12-07 NOTE — ED Provider Notes (Signed)
Medical screening examination/treatment/procedure(s) were performed by non-physician practitioner and as supervising physician I was immediately available for consultation/collaboration.   Gavin Pound. Oletta Lamas, MD 12/07/12 2227

## 2013-03-16 ENCOUNTER — Encounter (HOSPITAL_COMMUNITY): Payer: Self-pay | Admitting: Emergency Medicine

## 2013-03-16 ENCOUNTER — Emergency Department (HOSPITAL_COMMUNITY): Payer: Medicaid Other

## 2013-03-16 ENCOUNTER — Emergency Department (HOSPITAL_COMMUNITY)
Admission: EM | Admit: 2013-03-16 | Discharge: 2013-03-16 | Disposition: A | Discharge status: Home / Self Care | Payer: Medicaid Other | Attending: Emergency Medicine | Admitting: Emergency Medicine

## 2013-03-16 DIAGNOSIS — Z79899 Other long term (current) drug therapy: Secondary | ICD-10-CM | POA: Insufficient documentation

## 2013-03-16 DIAGNOSIS — J45909 Unspecified asthma, uncomplicated: Secondary | ICD-10-CM

## 2013-03-16 DIAGNOSIS — R000 Tachycardia, unspecified: Secondary | ICD-10-CM | POA: Insufficient documentation

## 2013-03-16 DIAGNOSIS — IMO0002 Reserved for concepts with insufficient information to code with codable children: Secondary | ICD-10-CM | POA: Insufficient documentation

## 2013-03-16 DIAGNOSIS — F17200 Nicotine dependence, unspecified, uncomplicated: Secondary | ICD-10-CM | POA: Insufficient documentation

## 2013-03-16 DIAGNOSIS — J45901 Unspecified asthma with (acute) exacerbation: Secondary | ICD-10-CM | POA: Insufficient documentation

## 2013-03-16 MED ORDER — IPRATROPIUM BROMIDE 0.02 % IN SOLN
0.5000 mg | Freq: Once | RESPIRATORY_TRACT | Status: AC
  Administered 2013-03-16: 0.5 mg via RESPIRATORY_TRACT
  Filled 2013-03-16: qty 2.5

## 2013-03-16 MED ORDER — PREDNISONE 20 MG PO TABS: 40.0000 mg | ORAL_TABLET | Freq: Every day | ORAL | Status: DC

## 2013-03-16 MED ORDER — ALBUTEROL SULFATE (5 MG/ML) 0.5% IN NEBU
5.0000 mg | INHALATION_SOLUTION | Freq: Once | RESPIRATORY_TRACT | Status: AC
  Administered 2013-03-16: 5 mg via RESPIRATORY_TRACT
  Filled 2013-03-16: qty 1

## 2013-03-16 MED ORDER — PREDNISONE 20 MG PO TABS
60.0000 mg | ORAL_TABLET | Freq: Once | ORAL | Status: AC
  Administered 2013-03-16: 60 mg via ORAL
  Filled 2013-03-16: qty 3

## 2013-03-16 MED ORDER — ALBUTEROL SULFATE HFA 108 (90 BASE) MCG/ACT IN AERS
2.0000 | INHALATION_SPRAY | RESPIRATORY_TRACT | Status: DC | PRN
  Filled 2013-03-16: qty 6.7

## 2013-03-16 MED ORDER — ALBUTEROL SULFATE HFA 108 (90 BASE) MCG/ACT IN AERS: 2.0000 | INHALATION_SPRAY | RESPIRATORY_TRACT | Status: DC | PRN

## 2013-03-16 NOTE — ED Notes (Signed)
Family reports pt having asthma symptoms. Pt was sitting down watching TV and started breathing heavy. Pt currently out of her asthma medications.

## 2013-03-16 NOTE — ED Notes (Signed)
Pt ambulated approximately 14ft unassisted without difficulty. O2 sats on RA 95-97%

## 2013-03-16 NOTE — ED Provider Notes (Signed)
CSN: [id number]     Arrival date & time 03/16/13  1817 History   First MD Initiated Contact with Patient 03/16/13 1835     Chief Complaint  Patient presents with  . Asthma   (Consider location/radiation/quality/duration/timing/severity/associated sxs/prior Treatment) HPI Comments:  Patient presents emergency department with chief complaint of asthma exacerbation. She states that her symptoms first started 1-2 hours ago. She states that she tried taking her  inhaler, but it was out of medication.  She states that she has had wheezing for the past several hours. She denies any other complaints. Denies any productive cough. Denies fever, chills, nausea, or vomiting. She has no other health problems. She is not in any apparent distress.  The history is provided by the patient. No language interpreter was used.    Past Medical History  Diagnosis Date  . Asthma    Past Surgical History  Procedure Laterality Date  . Incision and drainage of wound     Family History  Problem Relation Age of Onset  . Hyperlipidemia Mother   . Hypertension Mother   . Asthma Brother   . Cancer Maternal Grandmother 49    pancreatic  . Diabetes Maternal Grandmother   . Heart disease Maternal Grandfather   . Stroke Maternal Grandfather    History  Substance Use Topics  . Smoking status: Current Some Day Smoker -- 0.10 packs/day    Types: Cigarettes  . Smokeless tobacco: Not on file  . Alcohol Use: Yes   OB History   Grav Para Term Preterm Abortions TAB SAB Ect Mult Living                 Review of Systems  All other systems reviewed and are negative.    Allergies  Zyrtec  Home Medications   Current Outpatient Rx  Name  Route  Sig  Dispense  Refill  . albuterol (PROVENTIL HFA;VENTOLIN HFA) 108 (90 BASE) MCG/ACT inhaler   Inhalation   Inhale 2 puffs into the lungs every 6 (six) hours as needed for wheezing.         . beclomethasone (QVAR) 40 MCG/ACT inhaler   Inhalation   Inhale 1  puff into the lungs 2 (two) times daily.         . Multiple Vitamin (MULTIVITAMIN WITH MINERALS) TABS tablet   Oral   Take 1 tablet by mouth daily.          BP 105/86  Pulse 111  Temp(Src) 98.7 F (37.1 C) (Oral)  Resp 24  Ht 5\' 1"  (1.549 m)  Wt 100 lb (45.36 kg)  BMI 18.9 kg/m2  SpO2 99%  LMP 03/09/2013 Physical Exam  Nursing note and vitals reviewed. Constitutional: She is oriented to person, place, and time. She appears well-developed and well-nourished.  HENT:  Head: Normocephalic and atraumatic.  Eyes: Conjunctivae and EOM are normal. Pupils are equal, round, and reactive to light.  Neck: Normal range of motion. Neck supple.  Cardiovascular: Regular rhythm.  Exam reveals no gallop and no friction rub.   No murmur heard. Mildly tachycardic  Pulmonary/Chest: Effort normal. No respiratory distress. She has wheezes. She has no rales. She exhibits no tenderness.   Diffuse end expiratory wheezes  Abdominal: Soft. Bowel sounds are normal. She exhibits no distension and no mass. There is no tenderness. There is no rebound and no guarding.  Musculoskeletal: Normal range of motion. She exhibits no edema and no tenderness.  Neurological: She is alert and oriented to person, place, and  time.  Skin: Skin is warm and dry.  Psychiatric: She has a normal mood and affect. Her behavior is normal. Judgment and thought content normal.    ED Course  Procedures (including critical care time) Labs Review Labs Reviewed - No data to display Imaging Review Dg Chest 2 View (if Patient Has Fever And/or Copd)  03/16/2013   CLINICAL DATA:  Chest pain and shortness of breath.  EXAM: CHEST  2 VIEW  COMPARISON:  None.  FINDINGS: Normal sized heart. Clear lungs. Mild scoliosis.  IMPRESSION: No acute abnormality.  If   Electronically Signed   By: Gordan Payment M.D.   On: 03/16/2013 18:56    EKG Interpretation   None       MDM   1. Asthma      Patient feeling much better after nebulizer  treatment.  Patient ambulated in ED with O2 saturations maintained >90, no current signs of respiratory distress. Lung exam improved after nebulizer treatment. Prednisone given in the ED and pt will bd dc with 5 day burst. Pt states they are breathing at baseline. Pt has been instructed to continue using prescribed medications and to speak with PCP about today's exacerbation.    Roxy Horseman, PA-C 03/17/13 0028

## 2013-03-17 NOTE — ED Provider Notes (Signed)
Medical screening examination/treatment/procedure(s) were performed by non-physician practitioner and as supervising physician I was immediately available for consultation/collaboration.  Flint Melter, MD 03/17/13 [phone]

## 2013-04-02 ENCOUNTER — Ambulatory Visit (INDEPENDENT_AMBULATORY_CARE_PROVIDER_SITE_OTHER): Payer: Medicaid Other | Admitting: *Deleted

## 2013-04-02 DIAGNOSIS — Z23 Encounter for immunization: Secondary | ICD-10-CM

## 2013-07-04 ENCOUNTER — Ambulatory Visit (INDEPENDENT_AMBULATORY_CARE_PROVIDER_SITE_OTHER): Payer: Medicaid Other | Admitting: Family Medicine

## 2013-07-04 ENCOUNTER — Encounter: Payer: Self-pay | Admitting: Family Medicine

## 2013-07-04 VITALS — BP 136/84 | HR 83 | Temp 97.8°F | Ht 61.0 in | Wt 91.0 lb

## 2013-07-04 DIAGNOSIS — T07XXXA Unspecified multiple injuries, initial encounter: Secondary | ICD-10-CM

## 2013-07-04 NOTE — Patient Instructions (Signed)
Thank you for coming in, today!  Jillian Ramsey looks well, today. The bruises on her legs look worse than they actually are. They should slowly get better but it may take a few weeks. You can put ice in a Ziploc bag and wrap it in a towel, and hold it against the bruise to help with any pain or inflammation. You can continue to use Neosporin on the skin to help prevent any infection.  Her regular doctor is Dr. Pollie MeyerMcIntyre, now that Dr. Cristal FordKonkol is gone. She can schedule a regular visit with Dr. Pollie MeyerMcIntyre whenever you like. If she has any difficulty walking, increased pain, or if the bruises get worse or if they start to look infected, come back sooner rather than later (within the week).  Please feel free to call with any questions or concerns at any time, at [phone]. --Dr. Casper HarrisonStreet
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
## 2013-07-07 ENCOUNTER — Other Ambulatory Visit: Payer: Self-pay | Admitting: Family Medicine

## 2013-07-08 ENCOUNTER — Telehealth: Payer: Self-pay | Admitting: Family Medicine

## 2013-07-08 NOTE — Telephone Encounter (Signed)
Called pt. Informed. .Jillian Ramsey  

## 2013-07-08 NOTE — Telephone Encounter (Signed)
[facility] - please call pt and let her know I have refilled one month's worth of her qvar but she needs to schedule an appointment to follow up on her asthma and meet her new PCP. Thanks!  Latrelle DodrillBrittany J Lathaniel Legate, MD

## 2013-08-01 ENCOUNTER — Ambulatory Visit: Payer: Medicaid Other | Admitting: Family Medicine

## 2013-09-11 ENCOUNTER — Encounter: Payer: Self-pay | Admitting: Family Medicine

## 2013-09-11 ENCOUNTER — Ambulatory Visit (INDEPENDENT_AMBULATORY_CARE_PROVIDER_SITE_OTHER): Payer: Medicaid Other | Admitting: Family Medicine

## 2013-09-11 VITALS — BP 119/79 | HR 80 | Temp 97.7°F | Ht 61.0 in | Wt 93.0 lb

## 2013-09-11 DIAGNOSIS — R636 Underweight: Secondary | ICD-10-CM

## 2013-09-11 DIAGNOSIS — J4520 Mild intermittent asthma, uncomplicated: Secondary | ICD-10-CM

## 2013-09-11 DIAGNOSIS — J45909 Unspecified asthma, uncomplicated: Secondary | ICD-10-CM

## 2013-09-11 MED ORDER — BECLOMETHASONE DIPROPIONATE 40 MCG/ACT IN AERS: INHALATION_SPRAY | RESPIRATORY_TRACT | Status: DC

## 2013-09-11 MED ORDER — ALBUTEROL SULFATE HFA 108 (90 BASE) MCG/ACT IN AERS: 2.0000 | INHALATION_SPRAY | RESPIRATORY_TRACT | Status: DC | PRN

## 2013-09-11 NOTE — Assessment & Plan Note (Signed)
Well controlled. Discussed importance of taking qvar every day twice a day, even when she's feeling well. Also discussed mom showering/changing clothes/brushing teeth after she smokes to avoid pt having smoke exposure. Refills provided for 6 months worth. F/u in 6 mos time.

## 2013-09-11 NOTE — Assessment & Plan Note (Signed)
Weight stable from last January. Well appearing overall. 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 Ernest HaberKonkol's notes, pt and her sister are both underweight with cognitive deficits. Follow up in 6 months for weight recheck.

## 2013-09-11 NOTE — Patient Instructions (Signed)
It was nice to meet you today!  Take QVar every day twice per day, regardless of how you are feeling. Take albuterol as needed. If you need it more than twice a week, come in to see me.  Follow up in 6 months to recheck your weight.  Be well, Dr. Pollie MeyerMcIntyre

## 2013-09-11 NOTE — Progress Notes (Signed)
Patient ID: Jillian SanePrecious J Tegeler, female   DOB: 07-01-1990, 23 y.o.   MRN: [id number]  HPI:  Asthma: needs refills on medicines. Takes Qvar PRN, not daily. Requiring albuterol no more than 2 times per week. Last ER visit for asthma was in October. They have not identified any triggers. Her mom smokes outside and in her bedroom, but not around pt.  Low weight: has been evaluated for this in past. Weight is stable from this time last year. Eats three meals per day, eats a lot of food per mom. Does not exercise at all. Gets periods monthly. Does not use tampons, only uses pads.  ROS: See HPI  PMFSH: not sexually active, never has been. Denies using tobacco, alcohol, or drugs.  PHYSICAL EXAM: BP 119/79  Pulse 80  Temp(Src) 97.7 F (36.5 C) (Oral)  Ht 5\' 1"  (1.549 m)  Wt 93 lb (42.185 kg)  BMI 17.58 kg/m2  LMP 09/09/2013 Gen: NAD HEENT: NCAT Heart: RRR, no murmurs Lungs: CTAB, NWOB, no crackles or wheezes Abdomen: soft, nontender to palpation Neuro: grossly nonfocal, does show some cognitive delay, speech fluent  ASSESSMENT/PLAN:  # Health maintenance:  -discussed with pt and her mother today that since she has never had intercourse, is at low risk for cervical malignancy. Will defer pap screening at this time as I suspect it would be physically and perhaps emotionally traumatic for patient.  See problem based charting for additional assessment/plan.  FOLLOW UP: F/u in 6 months for weight check & asthma.  GrenadaBrittany J. Pollie MeyerMcIntyre, MD [facility]

## 2014-07-26 IMAGING — CR DG CHEST 2V
2 series · 2 of 2 positions shown · non-contrast
Comparison: None.

CLINICAL DATA: Chest pain and shortness of breath.

EXAM:
CHEST  2 VIEW

[w chest pa]
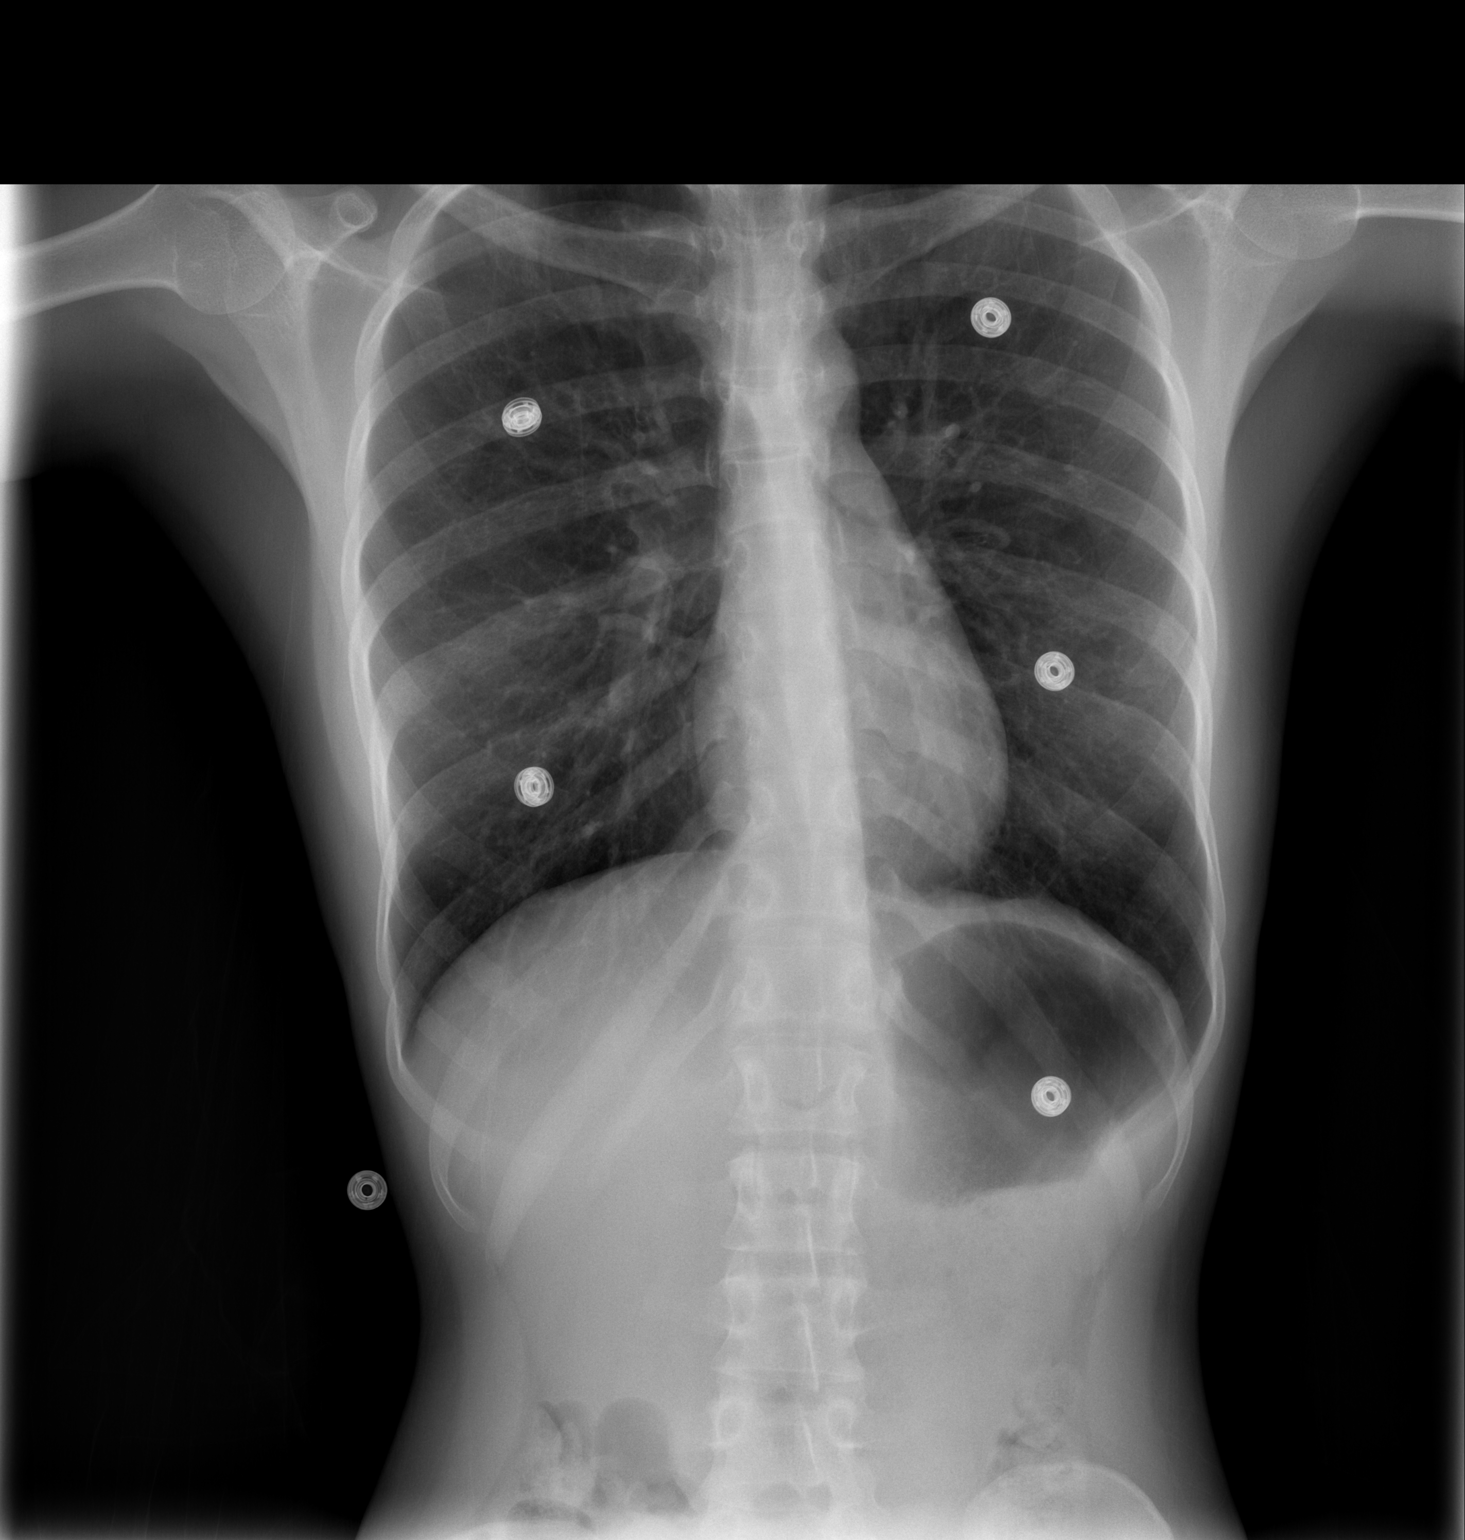

[w chest lat]
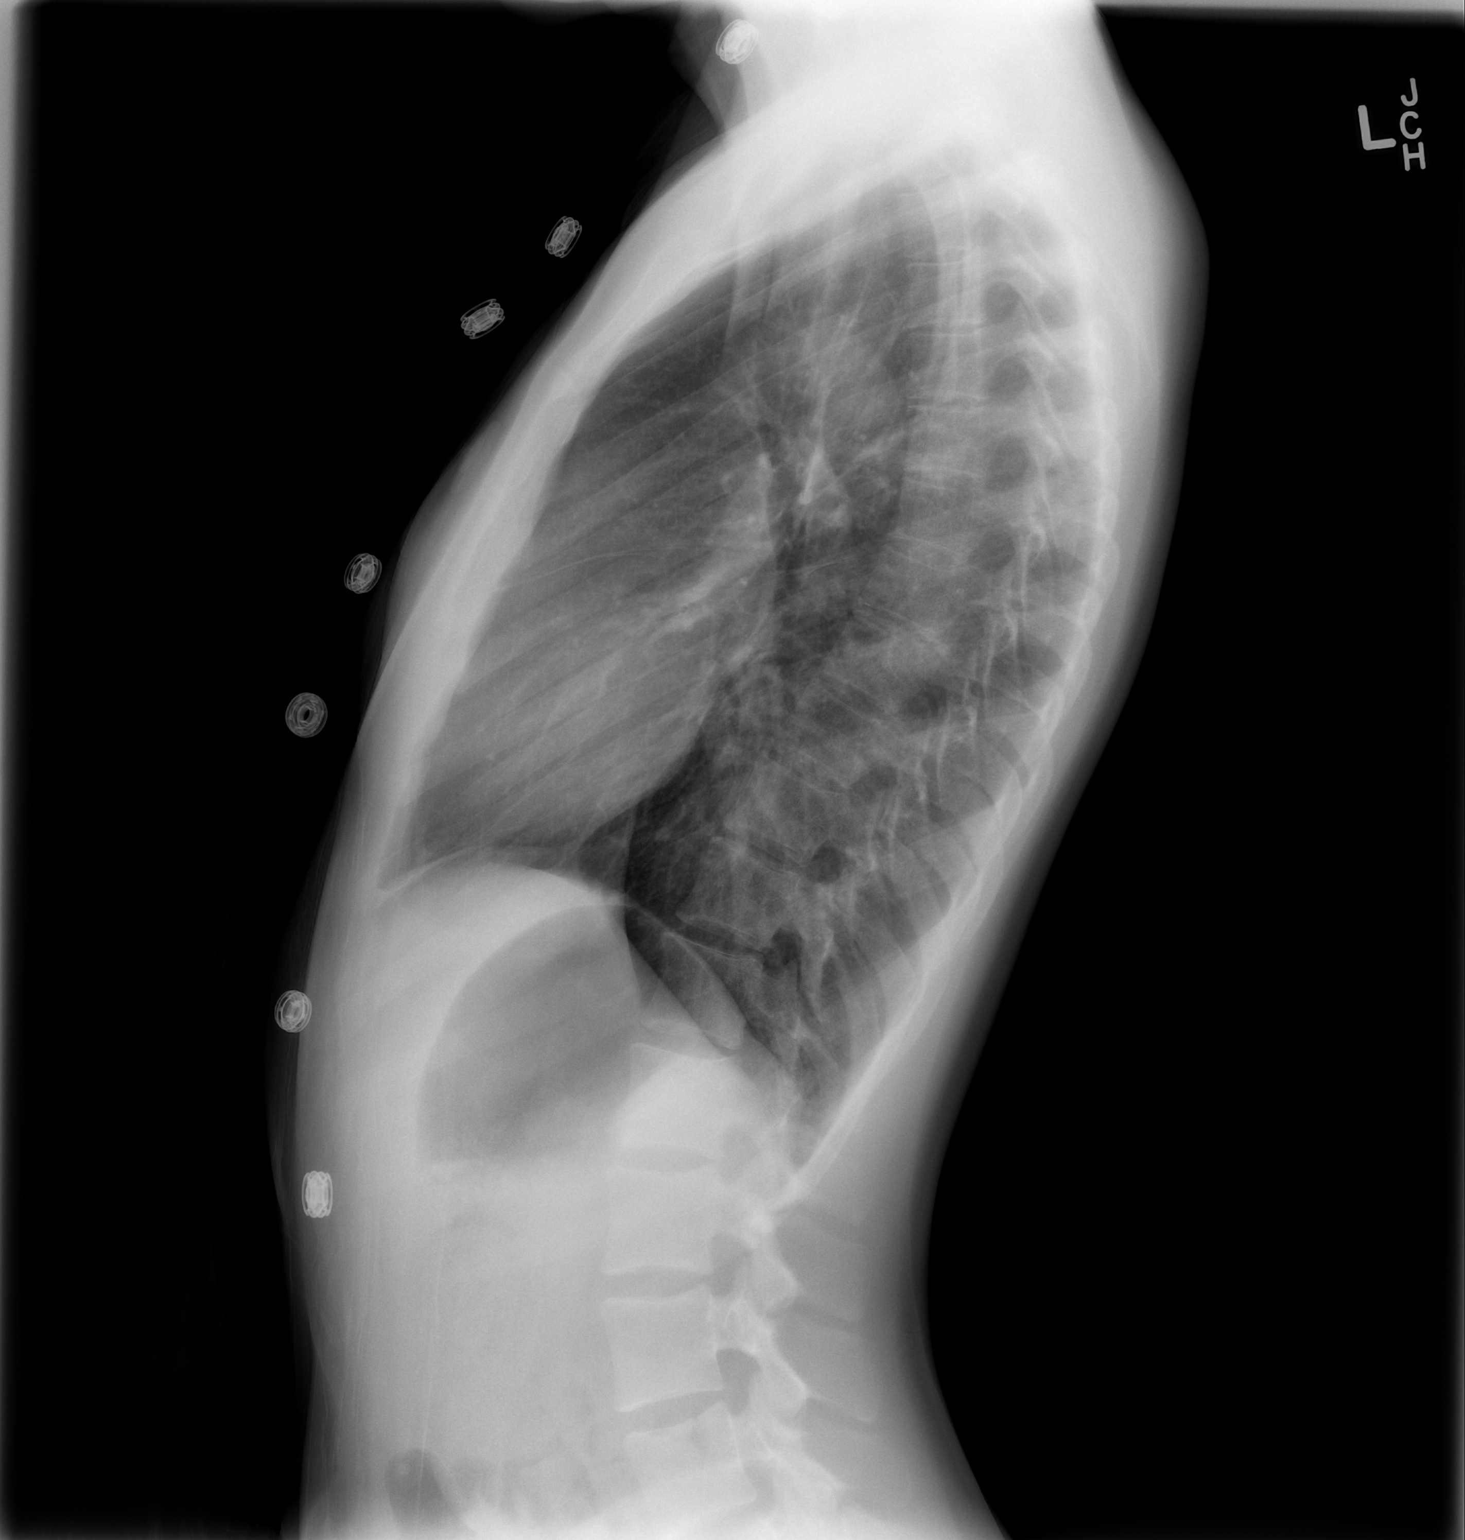

[2 of 2 positions shown; findings below may reference images not displayed]

FINDINGS: Normal sized heart. Clear lungs. Mild scoliosis.
IMPRESSION: No acute abnormality.  If

## 2014-12-20 ENCOUNTER — Emergency Department (HOSPITAL_COMMUNITY)
Admission: EM | Admit: 2014-12-20 | Discharge: 2014-12-20 | Disposition: A | Discharge status: Home / Self Care | Payer: Medicaid Other | Attending: Emergency Medicine | Admitting: Emergency Medicine

## 2014-12-20 ENCOUNTER — Encounter (HOSPITAL_COMMUNITY): Payer: Self-pay | Admitting: *Deleted

## 2014-12-20 DIAGNOSIS — Z79899 Other long term (current) drug therapy: Secondary | ICD-10-CM | POA: Insufficient documentation

## 2014-12-20 DIAGNOSIS — E876 Hypokalemia: Secondary | ICD-10-CM | POA: Diagnosis not present

## 2014-12-20 DIAGNOSIS — F419 Anxiety disorder, unspecified: Secondary | ICD-10-CM | POA: Insufficient documentation

## 2014-12-20 DIAGNOSIS — J45909 Unspecified asthma, uncomplicated: Secondary | ICD-10-CM | POA: Diagnosis present

## 2014-12-20 DIAGNOSIS — J4521 Mild intermittent asthma with (acute) exacerbation: Secondary | ICD-10-CM | POA: Diagnosis not present

## 2014-12-20 DIAGNOSIS — Z3202 Encounter for pregnancy test, result negative: Secondary | ICD-10-CM | POA: Insufficient documentation

## 2014-12-20 DIAGNOSIS — Z7951 Long term (current) use of inhaled steroids: Secondary | ICD-10-CM | POA: Diagnosis not present

## 2014-12-20 DIAGNOSIS — J4520 Mild intermittent asthma, uncomplicated: Secondary | ICD-10-CM

## 2014-12-20 DIAGNOSIS — Z720 Tobacco use: Secondary | ICD-10-CM | POA: Insufficient documentation

## 2014-12-20 LAB — I-STAT BETA HCG BLOOD, ED (MC, WL, AP ONLY)

## 2014-12-20 LAB — COMPREHENSIVE METABOLIC PANEL
ALT: 20 U/L (ref 14–54)
ANION GAP: 13 (ref 5–15)
AST: 27 U/L (ref 15–41)
Albumin: 4.9 g/dL (ref 3.5–5.0)
Alkaline Phosphatase: 68 U/L (ref 38–126)
BILIRUBIN TOTAL: 0.6 mg/dL (ref 0.3–1.2)
BUN: 12 mg/dL (ref 6–20)
CALCIUM: 10.2 mg/dL (ref 8.9–10.3)
CHLORIDE: 108 mmol/L (ref 101–111)
CO2: 20 mmol/L — AB (ref 22–32)
CREATININE: 0.9 mg/dL (ref 0.44–1.00)
Glucose, Bld: 96 mg/dL (ref 65–99)
Potassium: 2.9 mmol/L — ABNORMAL LOW (ref 3.5–5.1)
Sodium: 141 mmol/L (ref 135–145)
Total Protein: 8.5 g/dL — ABNORMAL HIGH (ref 6.5–8.1)

## 2014-12-20 LAB — CBC WITH DIFFERENTIAL/PLATELET
BASOS PCT: 1 % (ref 0–1)
Basophils Absolute: 0.1 10*3/uL (ref 0.0–0.1)
EOS PCT: 2 % (ref 0–5)
Eosinophils Absolute: 0.2 10*3/uL (ref 0.0–0.7)
HCT: 38.4 % (ref 36.0–46.0)
Hemoglobin: 12.3 g/dL (ref 12.0–15.0)
LYMPHS ABS: 1.5 10*3/uL (ref 0.7–4.0)
LYMPHS PCT: 15 % (ref 12–46)
MCH: 28.5 pg (ref 26.0–34.0)
MCHC: 32 g/dL (ref 30.0–36.0)
MCV: 88.9 fL (ref 78.0–100.0)
Monocytes Absolute: 0.6 10*3/uL (ref 0.1–1.0)
Monocytes Relative: 6 % (ref 3–12)
NEUTROS ABS: 7.5 10*3/uL (ref 1.7–7.7)
NEUTROS PCT: 76 % (ref 43–77)
PLATELETS: 357 10*3/uL (ref 150–400)
RBC: 4.32 MIL/uL (ref 3.87–5.11)
RDW: 12.9 % (ref 11.5–15.5)
WBC: 9.8 10*3/uL (ref 4.0–10.5)

## 2014-12-20 LAB — SALICYLATE LEVEL

## 2014-12-20 LAB — RAPID URINE DRUG SCREEN, HOSP PERFORMED
Amphetamines: NOT DETECTED
BARBITURATES: NOT DETECTED
Benzodiazepines: NOT DETECTED
COCAINE: NOT DETECTED
OPIATES: NOT DETECTED
TETRAHYDROCANNABINOL: NOT DETECTED

## 2014-12-20 LAB — ACETAMINOPHEN LEVEL: ACETAMINOPHEN (TYLENOL), SERUM: 11 ug/mL (ref 10–30)

## 2014-12-20 LAB — ETHANOL

## 2014-12-20 MED ORDER — LORAZEPAM 1 MG PO TABS
1.0000 mg | ORAL_TABLET | Freq: Once | ORAL | Status: AC
  Administered 2014-12-20: 1 mg via ORAL
  Filled 2014-12-20: qty 1

## 2014-12-20 MED ORDER — POTASSIUM CHLORIDE CRYS ER 20 MEQ PO TBCR
40.0000 meq | EXTENDED_RELEASE_TABLET | Freq: Once | ORAL | Status: AC
  Administered 2014-12-20: 40 meq via ORAL
  Filled 2014-12-20: qty 2

## 2014-12-20 MED ORDER — IPRATROPIUM-ALBUTEROL 0.5-2.5 (3) MG/3ML IN SOLN
3.0000 mL | Freq: Four times a day (QID) | RESPIRATORY_TRACT | Status: DC
  Filled 2014-12-20: qty 3

## 2014-12-20 MED ORDER — IPRATROPIUM-ALBUTEROL 0.5-2.5 (3) MG/3ML IN SOLN: 3.0000 mL | Freq: Once | RESPIRATORY_TRACT | Status: DC

## 2014-12-20 NOTE — ED Notes (Addendum)
Pt is increasingly lethargic and has visible tremors in both hands and legs.  She denies ingesting any substance or using any medication other than her albuterol inhaler today.  Her balance is unsteady and she required assistance transferring into and out of the bed/wheelchair as well as self-care during toileting.

## 2014-12-20 NOTE — ED Notes (Signed)
Bed: [id number] Expected date: 12/20/14 Expected time: 4:56 PM Means of arrival: Ambulance Comments: Asthma - treatment in progress
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
## 2014-12-20 NOTE — ED Notes (Signed)
Patient began to have symptoms about 2 hours ago. She just seemed to be very anxious when EMS arrived. She given  of albuterol in route. She states that she was at home she smelt some fumes that triggered the attack. Patient denies pain. She still seems to be somewhat anxious with very rapid speech. She denies any other medical issues.

## 2014-12-20 NOTE — Discharge Instructions (Signed)
Continue your inhalers as needed for asthma. Follow-up with your primary care physician. Return here for any new or worsening symptoms.
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
## 2015-12-11 ENCOUNTER — Ambulatory Visit (INDEPENDENT_AMBULATORY_CARE_PROVIDER_SITE_OTHER): Payer: Medicaid Other | Admitting: Family Medicine

## 2015-12-11 ENCOUNTER — Encounter: Payer: Self-pay | Admitting: Family Medicine

## 2015-12-11 VITALS — BP 111/74 | HR 80 | Temp 98.4°F | Wt 84.6 lb

## 2015-12-11 DIAGNOSIS — J4520 Mild intermittent asthma, uncomplicated: Secondary | ICD-10-CM | POA: Diagnosis present

## 2015-12-11 MED ORDER — ALBUTEROL SULFATE HFA 108 (90 BASE) MCG/ACT IN AERS: 2.0000 | INHALATION_SPRAY | RESPIRATORY_TRACT | Status: DC | PRN

## 2015-12-11 MED ORDER — BECLOMETHASONE DIPROPIONATE 40 MCG/ACT IN AERS: INHALATION_SPRAY | RESPIRATORY_TRACT | Status: DC

## 2015-12-11 NOTE — Patient Instructions (Signed)
Asthma Attack Prevention While you may not be able to control the fact that you have asthma, you can take actions to prevent asthma attacks. The best way to prevent asthma attacks is to maintain good control of your asthma. You can achieve this by:  Taking your medicines as directed.  Avoiding things that can irritate your airways or make your asthma symptoms worse (asthma triggers).  Keeping track of how well your asthma is controlled and of any changes in your symptoms.  Responding quickly to worsening asthma symptoms (asthma attack).  Seeking emergency care when it is needed. WHAT ARE SOME WAYS TO PREVENT AN ASTHMA ATTACK? Have a Plan Work with your health care provider to create a written plan for managing and treating your asthma attacks (asthma action plan). This plan includes:  A list of your asthma triggers and how you can avoid them.  Information on when medicines should be taken and when their dosages should be changed.  The use of a device that measures how well your lungs are working (peak flow meter). Monitor Your Asthma Use your peak flow meter and record your results in a journal every day. A drop in your peak flow numbers on one or more days may indicate the start of an asthma attack. This can happen even before you start to feel symptoms. You can prevent an asthma attack from getting worse by following the steps in your asthma action plan. Avoid Asthma Triggers Work with your asthma health care provider to find out what your asthma triggers are. This can be done by:  Allergy testing.  Keeping a journal that notes when asthma attacks occur and the factors that may have contributed to them.  Determining if there are other medical conditions that are making your asthma worse. Once you have determined your asthma triggers, take steps to avoid them. This may include avoiding excessive or prolonged exposure to:  Dust. Have someone dust and vacuum your home for you once or  twice a week. Using a high-efficiency particulate arrestance (HEPA) vacuum is best.  Smoke. This includes campfire smoke, forest fire smoke, and secondhand smoke from tobacco products.  Pet dander. Avoid contact with animals that you know you are allergic to.  Allergens from trees, grasses or pollens. Avoid spending a lot of time outdoors when pollen counts are high, and on very windy days.  Very cold, dry, or humid air.  Mold.  Foods that contain high amounts of sulfites.  Strong odors.  Outdoor air pollutants, such as engine exhaust.  Indoor air pollutants, such as aerosol sprays and fumes from household cleaners.  Household pests, including dust mites and cockroaches, and pest droppings.  Certain medicines, including NSAIDs. Always talk to your health care provider before stopping or starting any new medicines. Medicines Take over-the-counter and prescription medicines only as told by your health care provider. Many asthma attacks can be prevented by carefully following your medicine schedule. Taking your medicines correctly is especially important when you cannot avoid certain asthma triggers. Act Quickly If an asthma attack does happen, acting quickly can decrease how severe it is and how long it lasts. Take these steps:   Pay attention to your symptoms. If you are coughing, wheezing, or having difficulty breathing, do not wait to see if your symptoms go away on their own. Follow your asthma action plan.  If you have followed your asthma action plan and your symptoms are not improving, call your health care provider or seek immediate medical care   at the nearest hospital. It is important to note how often you need to use your fast-acting rescue inhaler. If you are using your rescue inhaler more often, it may mean that your asthma is not under control. Adjusting your asthma treatment plan may help you to prevent future asthma attacks and help you to gain better control of your  condition. HOW CAN I PREVENT AN ASTHMA ATTACK WHEN I EXERCISE? Follow advice from your health care provider about whether you should use your fast-acting inhaler before exercising. Many people with asthma experience exercise-induced bronchoconstriction (EIB). This condition often worsens during vigorous exercise in cold, humid, or dry environments. Usually, people with EIB can stay very active by pre-treating with a fast-acting inhaler before exercising.   This information is not intended to replace advice given to you by your health care provider. Make sure you discuss any questions you have with your health care provider.   Document Released: 05/11/2009 Document Revised: 02/11/2015 Document Reviewed: 10/23/2014 Elsevier Interactive Patient Education 2016 [employer]  
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
## 2015-12-11 NOTE — Assessment & Plan Note (Signed)
The patient is presenting with her mother for a refill on her Qvar and albuterol. She notes an asthma flare last week that is completely resolved since then. She is nontoxic on exam. She is afebrile, satting 100% on room air, with no abnormalities noted on exam.  Refilled Qvar and albuterol. -Advised patient to follow-up with her PCP in 2-3 months. - We discussed same-day appointments if needed for acute asthma flares and contacting the pharmacy or office for general refills on her asthma medications.

## 2016-02-04 ENCOUNTER — Ambulatory Visit (INDEPENDENT_AMBULATORY_CARE_PROVIDER_SITE_OTHER): Payer: Medicaid Other | Admitting: *Deleted

## 2016-02-04 DIAGNOSIS — Z23 Encounter for immunization: Secondary | ICD-10-CM

## 2017-06-13 ENCOUNTER — Ambulatory Visit (INDEPENDENT_AMBULATORY_CARE_PROVIDER_SITE_OTHER): Payer: Medicaid Other | Admitting: *Deleted

## 2017-06-13 DIAGNOSIS — Z23 Encounter for immunization: Secondary | ICD-10-CM

## 2017-09-14 ENCOUNTER — Ambulatory Visit: Payer: Medicaid Other | Admitting: Family Medicine

## 2017-09-14 ENCOUNTER — Encounter: Payer: Self-pay | Admitting: Family Medicine

## 2017-09-14 VITALS — BP 101/70 | HR 75 | Temp 98.2°F | Ht 61.0 in | Wt 93.0 lb

## 2017-09-14 DIAGNOSIS — Z0000 Encounter for general adult medical examination without abnormal findings: Secondary | ICD-10-CM | POA: Diagnosis not present

## 2017-09-14 DIAGNOSIS — Z113 Encounter for screening for infections with a predominantly sexual mode of transmission: Secondary | ICD-10-CM | POA: Insufficient documentation

## 2017-09-14 DIAGNOSIS — J4520 Mild intermittent asthma, uncomplicated: Secondary | ICD-10-CM

## 2017-09-14 DIAGNOSIS — Z23 Encounter for immunization: Secondary | ICD-10-CM

## 2017-09-14 DIAGNOSIS — Z01419 Encounter for gynecological examination (general) (routine) without abnormal findings: Secondary | ICD-10-CM | POA: Diagnosis not present

## 2017-09-14 MED ORDER — ALBUTEROL SULFATE HFA 108 (90 BASE) MCG/ACT IN AERS: 2.0000 | INHALATION_SPRAY | RESPIRATORY_TRACT | 2 refills | Status: DC | PRN

## 2017-09-14 MED ORDER — BECLOMETHASONE DIPROPIONATE 40 MCG/ACT IN AERS: INHALATION_SPRAY | RESPIRATORY_TRACT | 2 refills | Status: DC

## 2017-09-14 NOTE — Assessment & Plan Note (Signed)
Patient consents to HIV check and Tdap

## 2017-09-14 NOTE — Progress Notes (Signed)
    Subjective:  Jillian Ramsey is a 27 y.o. female who [facility] today with a chief complaint of ashtma med refills.   Patient with chronic mild asthma wants refills of inhalers that have been out for 2 weeks.  She has felt a bit wheezy during that time but with no crisis.  Her description of how she takes them seems to be both used as rescues.  She is otherwise feeling well with no complaints.  She has not been waking at night with asthma problems  She consents to pap smear/HIV test as part of preventative screening but has no symptomatic complaints.  Denies lesions/rashes/sores/discharge/dysuria/abdominal pain/newly abnormal bleeding etc.   Objective:  Physical Exam: BP 101/70 (BP Location: Left Arm, Patient Position: Sitting, Cuff Size: Normal)   Pulse 75   Temp 98.2 F (36.8 C) (Oral)   Wt 93 lb (42.2 kg)   SpO2 100%   BMI 17.57 kg/m   Gen: NAD, resting comfortably, thin CV: RRR with no murmurs appreciated Pulm: NWOB, CTAB with no crackles, wheezes, or rhonchi.  Good air movement.  No indication of current asthma exacerbation GI: Normal bowel sounds present. Soft, Nontender, Nondistended. MSK: no edema, cyanosis, or clubbing noted Skin: warm, dry Neuro: grossly normal, moves all extremities Psych: Normal affect and thought content  No results found for this or any previous visit (from the past 72 hour(s)).   Assessment/Plan:  Women's annual routine gynecological examination Patient consented to screening pap (no symptoms) but would like pcp to do the procedure so she has been scheduled with Dr. Mosetta PuttFeng 5/3  At checkout, patient's mother informed CNA that patient's sister had a female provider do a procedure and lied about inappropriate activity almost costing the provider their license.  There are no details provided or corroborated and this was not specifically about this patient.  Preventative health care Patient consents to HIV check and Tdap  Mild  intermittent asthma Patient on qvar and albuterol, ran out of both ~2wks ago.  Has felt she was a bit wheezy but with no crisis in that time.  Was using both intermittently as rescues so we talked about proper uses of the meds and verbally reviewed an asthma action plan.  She has prior hospitalization years ago for asthma  Meds refilled for both inhalers   Marthenia RollingScott Joli Koob, DO FAMILY MEDICINE RESIDENT - PGY1 09/14/2017 9:03 AM

## 2017-09-14 NOTE — Assessment & Plan Note (Addendum)
Patient consented to screening pap (no symptoms) but would like pcp to do the procedure so she has been scheduled with Dr. Mosetta PuttFeng 5/3  At checkout, patient's mother informed CNA that patient's sister had a female provider do a procedure and lied about inappropriate activity almost costing the provider their license.  There are no details provided or corroborated and this was not specifically about this patient.

## 2017-09-14 NOTE — Patient Instructions (Signed)
It was a pleasure to see you today! Thank you for choosing [facility] for your primary care. Jillian Ramsey was seen for ashtma med refills. Come back to the clinic on 5/3 for your pap smear, and go to the emergency room if you have any significant breathing problems not resolved by albuterol inhaler.   Thank you for coming in, We've refilled your meds.   We think you made a good choice to get a pap smear and have scheduled you with Dr. Mosetta PuttFeng at your request.   If we did any lab work today, and the results require attention, either me or my nurse will get in touch with you. If everything is normal, you will get a letter in mail and a message via . If you don't hear from us in two weeks, please give us a call. Otherwise, we look forward to seeing you again at your next visit. If you have any questions or concerns before then, please call the clinic at [phone].  Please bring all your medications to every doctors visit  Sign up for My Chart to have easy access to your labs results, and communication with your Primary care physician.    Please check-out at the front desk before leaving the clinic.    Best,  Dr. Marthenia RollingScott Remington Skalsky FAMILY MEDICINE RESIDENT - PGY1 09/14/2017 8:51 AM

## 2017-09-14 NOTE — Assessment & Plan Note (Signed)
Patient on qvar and albuterol, ran out of both ~2wks ago.  Has felt she was a bit wheezy but with no crisis in that time.  Was using both intermittently as rescues so we talked about proper uses of the meds and verbally reviewed an asthma action plan.  She has prior hospitalization years ago for asthma  Meds refilled for both inhalers

## 2017-09-16 LAB — HIV ANTIBODY (ROUTINE TESTING W REFLEX): HIV Screen 4th Generation wRfx: NONREACTIVE

## 2017-09-18 ENCOUNTER — Encounter: Payer: Self-pay | Admitting: Family Medicine

## 2017-10-06 ENCOUNTER — Other Ambulatory Visit: Payer: Self-pay

## 2017-10-06 ENCOUNTER — Encounter: Payer: Self-pay | Admitting: Student in an Organized Health Care Education/Training Program

## 2017-10-06 ENCOUNTER — Other Ambulatory Visit (HOSPITAL_COMMUNITY)
Admission: RE | Admit: 2017-10-06 | Discharge: 2017-10-06 | Disposition: A | Discharge status: Home / Self Care | Payer: Medicaid Other | Source: Ambulatory Visit | Attending: Family Medicine | Admitting: Family Medicine

## 2017-10-06 ENCOUNTER — Ambulatory Visit: Payer: Medicaid Other | Admitting: Student in an Organized Health Care Education/Training Program

## 2017-10-06 VITALS — BP 108/52 | HR 88 | Temp 97.8°F | Ht 61.0 in | Wt 87.8 lb

## 2017-10-06 DIAGNOSIS — Z124 Encounter for screening for malignant neoplasm of cervix: Secondary | ICD-10-CM | POA: Diagnosis not present

## 2017-10-06 DIAGNOSIS — Z113 Encounter for screening for infections with a predominantly sexual mode of transmission: Secondary | ICD-10-CM | POA: Insufficient documentation

## 2017-10-06 LAB — POCT WET PREP (WET MOUNT)
Clue Cells Wet Prep Whiff POC: NEGATIVE
Trichomonas Wet Prep HPF POC: ABSENT

## 2017-10-06 NOTE — Assessment & Plan Note (Signed)
Very challenging cervical exam.  Enteritis was very small, unable to insert a small speculum.  We were able to obtain a pediatric speculum from the colpo cart.  Is able to insert pediatric speculum, however cervical exam was limited with very limited visibility.  Used a broom for the Pap, which was done pretty much blindly.  Will likely need a pediatric speculum in the future for any Pap exams.  If this Pap results inconclusive we will plan to send the patient to GYN to see if they are able to obtain a better study.  She does report that she had HPV vaccination.

## 2017-10-06 NOTE — Assessment & Plan Note (Signed)
Patient states she has not been sexually active, however she does agree to STI testing so we will do this today. - GC/chla off of PAP - RPR, HIV - wet prep for trich - will follow up results with patients

## 2017-10-06 NOTE — Progress Notes (Addendum)
Subjective:    Jillian Ramsey - 27 y.o. female MRN [id number]  Date of birth: [birth year]  HPI  Jillian Ramsey is here for Pap test.  Pap smear Patient reports that she has never had a Pap smear in the past.  She reports that she is not having any abnormal vaginal discharge, no rashes or lesions, and no vaginal discomfort.  She reports that she has not been sexually active.  She reports that she is amenable to STI testing today.  No dysuria, urinary frequency or urgency. She does report that she has had the HPV vaccine.  Health Maintenance:  Health Maintenance Due  Topic Date Due  . PAP SMEAR  12/25/2011    -  reports that she is a non-smoker but has been exposed to tobacco smoke. She has been exposed to 0.10 packs per day. She has never used smokeless tobacco. - Review of Systems: Per HPI. - Past Medical History: Patient Active Problem List   Diagnosis Date Noted  . Screening for malignant neoplasm of cervix 10/06/2017  . Women's annual routine gynecological examination 09/14/2017  . Routine screening for STI (sexually transmitted infection) 09/14/2017  . Multiple bruises 07/04/2013  . Underweight 06/13/2012  . Cognitive deficits 06/13/2012  . Behavioral problem 06/13/2012  . Mild intermittent asthma 10/28/2011   - Medications: reviewed and updated Current Outpatient Medications  Medication Sig Dispense Refill  . albuterol (PROVENTIL HFA;VENTOLIN HFA) 108 (90 Base) MCG/ACT inhaler Inhale 2 puffs into the lungs every 4 (four) hours as needed for wheezing or shortness of breath. 2 Inhaler 2  . beclomethasone (QVAR) 40 MCG/ACT inhaler INHALE ONE PUFF BY MOUTH TWICE DAILY 2 Inhaler 2  . Multiple Vitamin (MULTIVITAMIN WITH MINERALS) TABS tablet Take 1 tablet by mouth daily.     No current facility-administered medications for this visit.     Review of Systems See HPI     Objective:   Physical Exam BP (!) 108/52   Pulse 88   Temp 97.8 F (36.6 C) (Oral)   Ht   (1.549 m)   Wt 87 lb 12.8 oz (39.8 kg)   LMP 09/15/2017 (Approximate)   SpO2 98%   BMI 16.59 kg/m  Gen: NAD, alert, cooperative with exam, well-appearing  HEENT: NCAT, PERRL, clear conjunctiva, oropharynx clear, supple neck CV: RRR, good S1/S2, no murmur, no edema, capillary refill brisk  Resp: CTABL, no wheezes, non-labored Abd: SNTND, BS present, no guarding or organomegaly Skin: no rashes, normal turgor  Neuro: no gross deficits.  Psych: good insight, alert and oriented  Female genitalia: Vagina: normal appearing vagina with normal color and discharge, no lesions. Small introitus, pediatric speculum used. Cervix minimally visualized, small part visualized was normal appearing.   Chaperone CMA Clemencia Course present in the room for all parts of the exam.    Assessment & Plan:   Routine screening for STI (sexually transmitted infection) Patient states she has not been sexually active, however she does agree to STI testing so we will do this today. - GC/chla off of PAP - RPR, HIV - wet prep for trich - will follow up results with patients  Screening for malignant neoplasm of cervix Very challenging cervical exam.  Enteritis was very small, unable to insert a small speculum.  We were able to obtain a pediatric speculum from the colpo cart.  Is able to insert pediatric speculum, however cervical exam was limited with very limited visibility.  Used a broom for the Pap, which was done  pretty much blindly.  Will likely need a pediatric speculum in the future for any Pap exams.  If this Pap results inconclusive we will plan to send the patient to GYN to see if they are able to obtain a better study.  She does report that she had HPV vaccination.   Orders Placed This Encounter  Procedures  . HIV antibody (with reflex)  . RPR  . POCT Wet Prep [facility])   Howard Pouch, MD,MS,  PGY2 10/06/2017 10:55 AM

## 2017-10-06 NOTE — Patient Instructions (Signed)
It was a pleasure seeing you today in our clinic. We completed your PAP today. Here is the treatment plan we have discussed and agreed upon together:  We drew blood work at today's visit. I will call or send you a letter with these results. If you do not hear from me within the next week, please give our office a call. Our clinic's number is [phone]. Please call with questions or concerns about what we discussed today.  Be well, Dr. Mosetta Putt  Sign up for My Chart to have easy access to your labs results, and communication with your primary care physician.

## 2017-10-07 LAB — HIV ANTIBODY (ROUTINE TESTING W REFLEX): HIV Screen 4th Generation wRfx: NONREACTIVE

## 2017-10-07 LAB — RPR: RPR Ser Ql: NONREACTIVE

## 2017-10-09 LAB — CERVICOVAGINAL ANCILLARY ONLY
Chlamydia: NEGATIVE
Neisseria Gonorrhea: NEGATIVE

## 2017-10-13 ENCOUNTER — Ambulatory Visit: Payer: Medicaid Other | Admitting: Student in an Organized Health Care Education/Training Program

## 2017-11-27 ENCOUNTER — Telehealth: Payer: Self-pay

## 2017-11-27 MED ORDER — FLUTICASONE PROPIONATE HFA 44 MCG/ACT IN AERO: 2.0000 | INHALATION_SPRAY | Freq: Two times a day (BID) | RESPIRATORY_TRACT | 2 refills | Status: DC

## 2017-11-27 NOTE — Telephone Encounter (Signed)
Patient's mother called and said patient was only able to pick up albuterol inhaler in April. Pharmacy told her the Qvar inhaler no longer made.  Spoke with pharmacist and Rx would need to be sent as Qvar Redihaler.  FYI, this is non-preferred on Medicaid and a PA will be required. Preferred options are: Flovent HFA Inhaler  Pulmicort  Respules 0.25mg , 0.5mg , 1mg   Call back to Nettie ElmSylvia, mother, is [phone].  Ples SpecterAlisa Brake, RN [facility] RN)

## 2017-11-28 ENCOUNTER — Other Ambulatory Visit: Payer: Self-pay

## 2017-11-28 MED ORDER — FLUTICASONE PROPIONATE HFA 44 MCG/ACT IN AERO: 2.0000 | INHALATION_SPRAY | Freq: Two times a day (BID) | RESPIRATORY_TRACT | 2 refills | Status: DC

## 2017-11-28 NOTE — Telephone Encounter (Signed)
Completed on a different encounter. Alisa Brake, RN ([facility] RN)  

## 2018-09-18 ENCOUNTER — Other Ambulatory Visit: Payer: Self-pay | Admitting: Family Medicine

## 2018-09-18 DIAGNOSIS — J4520 Mild intermittent asthma, uncomplicated: Secondary | ICD-10-CM

## 2018-09-18 MED ORDER — FLUTICASONE PROPIONATE HFA 44 MCG/ACT IN AERO: 2.0000 | INHALATION_SPRAY | Freq: Two times a day (BID) | RESPIRATORY_TRACT | 2 refills | Status: AC

## 2019-07-05 ENCOUNTER — Ambulatory Visit: Payer: Medicaid Other | Attending: Internal Medicine

## 2019-07-05 DIAGNOSIS — Z20822 Contact with and (suspected) exposure to covid-19: Secondary | ICD-10-CM

## 2019-07-06 LAB — NOVEL CORONAVIRUS, NAA: SARS-CoV-2, NAA: NOT DETECTED

## 2021-11-09 ENCOUNTER — Encounter: Payer: Self-pay | Admitting: *Deleted

## 2024-04-02 ENCOUNTER — Emergency Department (HOSPITAL_COMMUNITY)
Admission: EM | Admit: 2024-04-02 | Discharge: 2024-04-02 | Disposition: A | Discharge status: Home / Self Care | Attending: Emergency Medicine | Admitting: Emergency Medicine

## 2024-04-02 ENCOUNTER — Encounter (HOSPITAL_COMMUNITY): Payer: Self-pay | Admitting: Emergency Medicine

## 2024-04-02 ENCOUNTER — Emergency Department (HOSPITAL_COMMUNITY)

## 2024-04-02 ENCOUNTER — Other Ambulatory Visit: Payer: Self-pay

## 2024-04-02 DIAGNOSIS — E876 Hypokalemia: Secondary | ICD-10-CM

## 2024-04-02 DIAGNOSIS — R109 Unspecified abdominal pain: Secondary | ICD-10-CM | POA: Diagnosis present

## 2024-04-02 DIAGNOSIS — N3091 Cystitis, unspecified with hematuria: Secondary | ICD-10-CM

## 2024-04-02 DIAGNOSIS — R1031 Right lower quadrant pain: Secondary | ICD-10-CM | POA: Insufficient documentation

## 2024-04-02 DIAGNOSIS — R1030 Lower abdominal pain, unspecified: Secondary | ICD-10-CM

## 2024-04-02 LAB — COMPREHENSIVE METABOLIC PANEL WITH GFR
ALT: 14 U/L (ref 0–44)
AST: 21 U/L (ref 15–41)
Albumin: 4.3 g/dL (ref 3.5–5.0)
Alkaline Phosphatase: 47 U/L (ref 38–126)
Anion gap: 14 (ref 5–15)
BUN: 9 mg/dL (ref 6–20)
CO2: 21 mmol/L — ABNORMAL LOW (ref 22–32)
Calcium: 9.6 mg/dL (ref 8.9–10.3)
Chloride: 103 mmol/L (ref 98–111)
Creatinine, Ser: 0.82 mg/dL (ref 0.44–1.00)
GFR, Estimated: >60 mL/min (ref >60)
Glucose, Bld: 109 mg/dL — ABNORMAL HIGH (ref 70–99)
Potassium: 3.3 mmol/L — ABNORMAL LOW (ref 3.5–5.1)
Sodium: 138 mmol/L (ref 135–145)
Total Bilirubin: 0.5 mg/dL (ref 0.0–1.2)
Total Protein: 7.8 g/dL (ref 6.5–8.1)

## 2024-04-02 LAB — URINALYSIS, ROUTINE W REFLEX MICROSCOPIC
Bilirubin Urine: NEGATIVE
Glucose, UA: NEGATIVE mg/dL
Ketones, ur: 20 mg/dL — AB
Leukocytes,Ua: NEGATIVE
Nitrite: NEGATIVE
Protein, ur: 30 mg/dL — AB
RBC / HPF: >50 RBC/hpf (ref 0–5)
Specific Gravity, Urine: 1.028 (ref 1.005–1.030)
pH: 5 (ref 5.0–8.0)

## 2024-04-02 LAB — CBC
HCT: 38.9 % (ref 36.0–46.0)
Hemoglobin: 12.5 g/dL (ref 12.0–15.0)
MCH: 28.6 pg (ref 26.0–34.0)
MCHC: 32.1 g/dL (ref 30.0–36.0)
MCV: 89 fL (ref 80.0–100.0)
Platelets: 361 K/uL (ref 150–400)
RBC: 4.37 MIL/uL (ref 3.87–5.11)
RDW: 12.5 % (ref 11.5–15.5)
WBC: 7 K/uL (ref 4.0–10.5)
nRBC: 0 % (ref 0.0–0.2)

## 2024-04-02 LAB — HCG, SERUM, QUALITATIVE: Preg, Serum: NEGATIVE

## 2024-04-02 LAB — LIPASE, BLOOD: Lipase: 29 U/L (ref 11–51)

## 2024-04-02 MED ORDER — CEPHALEXIN 500 MG PO CAPS: 500.0000 mg | ORAL_CAPSULE | Freq: Four times a day (QID) | ORAL | 0 refills | Status: AC

## 2024-04-02 MED ORDER — KETOROLAC TROMETHAMINE 15 MG/ML IJ SOLN
15.0000 mg | Freq: Once | INTRAMUSCULAR | Status: AC
  Administered 2024-04-02: 15 mg via INTRAVENOUS
  Filled 2024-04-02: qty 1

## 2024-04-02 MED ORDER — ONDANSETRON HCL 4 MG/2ML IJ SOLN
4.0000 mg | Freq: Once | INTRAMUSCULAR | Status: AC
  Administered 2024-04-02: 4 mg via INTRAVENOUS
  Filled 2024-04-02: qty 2

## 2024-04-02 MED ORDER — POTASSIUM CHLORIDE CRYS ER 20 MEQ PO TBCR
40.0000 meq | EXTENDED_RELEASE_TABLET | Freq: Once | ORAL | Status: AC
Start: 2024-04-02 — End: 2024-04-02
  Administered 2024-04-02: 40 meq via ORAL
  Filled 2024-04-02: qty 2

## 2024-04-02 MED ORDER — IOHEXOL 350 MG/ML SOLN
45.0000 mL | Freq: Once | INTRAVENOUS | Status: AC | PRN
  Administered 2024-04-02: 45 mL via INTRAVENOUS

## 2024-04-02 MED ORDER — CEPHALEXIN 250 MG PO CAPS
500.0000 mg | ORAL_CAPSULE | Freq: Once | ORAL | Status: AC
  Administered 2024-04-02: 500 mg via ORAL
  Filled 2024-04-02: qty 2

## 2024-04-02 MED ORDER — LACTATED RINGERS IV BOLUS
1000.0000 mL | Freq: Once | INTRAVENOUS | Status: AC
  Administered 2024-04-02: 1000 mL via INTRAVENOUS

## 2024-04-02 NOTE — ED Triage Notes (Signed)
 Pt arrive by [facility] for c/o mid abd pain that started 30 min prior to arrival to ED, no nausea, vomiting or diarrhea, pt describe pain and cramping. Just finished her period yesterday.  VS 148/80, HR 80, R 16, SPO2 99% RA.

## 2024-04-02 NOTE — Discharge Instructions (Addendum)
 Overall, your testing is reassuring.  No evidence of appendicitis or other acute surgical problem.  Your urine does have some blood in it -  this could represent a bladder infection or could be a remnant of your recent period. Take antibiotic (keflex) as prescribed. Drink plenty of fluids/stay well hydrated.     Follow-up with your doctor in 2 days for recheck if symptoms fail to improve/resolve.  On the lab tests, your potassium level  is mildly low - eat plenty of fruits and vegetables, and follow up with primary care doctor.   Return to ER right away if worse, new symptoms, fevers, new or worsening or severe abdominal pain, persistent vomiting, or other concern.

## 2024-04-02 NOTE — ED Provider Notes (Signed)
 Signed out that u/s pending and if/when ok to d/c to home.   U/s neg for acute process.   Pt presented with suprapubic area pain.  No dysuria. As there is indication of cystitis on ct, ? Possibility of hemorrhagic cystitis. Confirmed only allergy is zyrtec w pt.  Keflex po.  Pt currently appears stable for ed d/c.   Return precautions provided.      Bernard Drivers, MD 04/02/24 [phone]

## 2024-04-02 NOTE — ED Provider Notes (Signed)
 [facility] DEPARTMENT AT [facility] Provider Note   CSN: [id number] Arrival date & time: 04/02/24  0203     Patient presents with: Abdominal Pain   Jillian Ramsey is a 33 y.o. female.   Patient with lower abdominal pain that started about midnight while she was trying to go to sleep.  Symptoms lasted for several hours and improved with ibuprofen at home.  Pain is just about resolved but still present.  She has never had this kind of pain in the past.  Associate with nausea but no vomiting.  Did finish her menses yesterday and this does not feel like menstrual cramps.  No bleeding or discharge currently.  No pain with urination or blood in the urine.  States she was recently diagnosed with chronic constipation and takes MiraLAX.  Last bowel movement was around midnight.  No previous abdominal surgeries.  Does not feel like this is menstrual pain.  No back pain or chest pain.  No shortness of breath, cough or fever.  The history is provided by the patient.  Abdominal Pain Associated symptoms: nausea   Associated symptoms: no chest pain, no dysuria, no fever, no hematuria, no shortness of breath and no vomiting        Prior to Admission medications   Medication Sig Start Date End Date Taking? Authorizing Provider  fluticasone  (FLOVENT  HFA) 44 MCG/ACT inhaler Inhale 2 puffs into the lungs 2 (two) times daily. 09/18/18 09/18/19  Lanny Maxwell, MD  Multiple Vitamin (MULTIVITAMIN WITH MINERALS) TABS tablet Take 1 tablet by mouth daily.    [provider]  PROVENTIL  HFA 108 (90 Base) MCG/ACT inhaler INHALE 2 PUFFS INTO THE LUNGS EVERY 4 HOURS AS NEEDED FOR WHEEZING OR SHORTNESS OF BREATH 09/18/18   Lanny Maxwell, MD    Allergies: Zyrtec [cetirizine hcl]    Review of Systems  Constitutional:  Positive for activity change and appetite change. Negative for fever.  HENT:  Negative for congestion.   Respiratory:  Negative for chest tightness and shortness of  breath.   Cardiovascular:  Negative for chest pain.  Gastrointestinal:  Positive for abdominal pain and nausea. Negative for vomiting.  Genitourinary:  Negative for dysuria and hematuria.  Musculoskeletal:  Negative for arthralgias and myalgias.  Skin:  Negative for rash.  Neurological:  Negative for dizziness, weakness and headaches.   all other systems are negative except as noted in the HPI and PMH.    Updated Vital Signs BP (!) 123/93 (BP Location: Right Arm)   Pulse (!) 106   Temp 99 F (37.2 C) (Oral)   Resp 14   Ht 5' 1 (1.549 m)   Wt 40 kg   LMP 03/27/2024 (Exact Date)   SpO2 100%   BMI 16.66 kg/m   Physical Exam Vitals and nursing note reviewed.  Constitutional:      General: She is not in acute distress.    Appearance: She is well-developed.  HENT:     Head: Normocephalic and atraumatic.     Mouth/Throat:     Pharynx: No oropharyngeal exudate.  Eyes:     Conjunctiva/sclera: Conjunctivae normal.     Pupils: Pupils are equal, round, and reactive to light.  Neck:     Comments: No meningismus. Cardiovascular:     Rate and Rhythm: Normal rate and regular rhythm.     Heart sounds: Normal heart sounds. No murmur heard. Pulmonary:     Effort: Pulmonary effort is normal. No respiratory distress.  Breath sounds: Normal breath sounds.  Abdominal:     Palpations: Abdomen is soft.     Tenderness: There is abdominal tenderness. There is no guarding or rebound.     Comments: Tender periumbilical and right lower quadrant without guarding or rebound.  No CVA tenderness  Musculoskeletal:        General: No tenderness. Normal range of motion.     Cervical back: Normal range of motion and neck supple.  Skin:    General: Skin is warm.  Neurological:     Mental Status: She is alert and oriented to person, place, and time.     Cranial Nerves: No cranial nerve deficit.     Motor: No abnormal muscle tone.     Coordination: Coordination normal.     Comments:  5/5 strength  throughout. CN 2-12 intact.Equal grip strength.   Psychiatric:        Behavior: Behavior normal.     (all labs ordered are listed, but only abnormal results are displayed) Labs Reviewed  COMPREHENSIVE METABOLIC PANEL WITH GFR - Abnormal; Notable for the following components:      Result Value   Potassium 3.3 (*)    CO2 21 (*)    Glucose, Bld 109 (*)    All other components within normal limits  URINALYSIS, ROUTINE W REFLEX MICROSCOPIC - Abnormal; Notable for the following components:   Color, Urine AMBER (*)    APPearance HAZY (*)    Hgb urine dipstick LARGE (*)    Ketones, ur 20 (*)    Protein, ur 30 (*)    Bacteria, UA RARE (*)    All other components within normal limits  URINE CULTURE  LIPASE, BLOOD  CBC  HCG, SERUM, QUALITATIVE    EKG: None  Radiology: CT ABDOMEN PELVIS W CONTRAST Result Date: 04/02/2024 EXAM: CT ABDOMEN AND PELVIS WITH CONTRAST 04/02/2024 05:55:15 AM TECHNIQUE: CT of the abdomen and pelvis was performed with the administration of 45 mL of iohexol (OMNIPAQUE) 350 MG/ML injection. Multiplanar reformatted images are provided for review. Automated exposure control, iterative reconstruction, and/or weight-based adjustment of the mA/kV was utilized to reduce the radiation dose to as low as reasonably achievable. COMPARISON: None available. CLINICAL HISTORY: Abdominal pain, acute, nonlocalized; periumbilical abdominal pain. Pt arrive by [facility] for c/o mid abd pain that started 30 min prior to arrival to ED, no nausea, vomiting or diarrhea, pt describe pain and cramping. Just finished her period yesterday. FINDINGS: LOWER CHEST: No acute abnormality. LIVER: The liver is mildly steatotic without mass enhancement. GALLBLADDER AND BILE DUCTS: The gallbladder and bile ducts are unremarkable. SPLEEN: No acute abnormality. PANCREAS: No acute abnormality. ADRENAL GLANDS: No acute abnormality. KIDNEYS, URETERS AND BLADDER: No stones in the kidneys or ureters. No hydronephrosis.  No perinephric or periureteral stranding. The bladder appears circumferentially thickened but is also nondistended. Correlate with urinalysis for potential cystitis. GI AND BOWEL: Stomach demonstrates no acute abnormality. There is no bowel obstruction. The appendix is normal. PERITONEUM AND RETROPERITONEUM: There is a small volume of low-density pelvic cul-de-sac fluid, which could be physiologic at this patient's age. No free hemorrhage, free air, or localized collection. No focal inflammation is seen. VASCULATURE: Aorta is normal in caliber. LYMPH NODES: No lymphadenopathy. REPRODUCTIVE ORGANS: The uterus and adnexal spaces are unremarkable apart from bilateral mild pelvic venous congestion. BONES AND SOFT TISSUES: No acute osseous abnormality. No focal soft tissue abnormality. IMPRESSION: 1. Circumferential bladder wall thickening in a nondistended bladder, which can be seen with cystitis; recommend  urinalysis. 2. Mild hepatic steatosis. 3. Pelvic venous congestion. 4. Mild free cul-de-sac fluid, probably physiologic given age. Electronically signed by: Francis Quam MD 04/02/2024 06:10 AM EDT RP Workstation: [id number]     Procedures   Medications Ordered in the ED  ketorolac (TORADOL) 15 MG/ML injection 15 mg (has no administration in time range)  ondansetron (ZOFRAN) injection 4 mg (has no administration in time range)                                    Medical Decision Making Amount and/or Complexity of Data Reviewed Labs: ordered. Decision-making details documented in ED Course. Radiology: ordered and independent interpretation performed. Decision-making details documented in ED Course. ECG/medicine tests: ordered and independent interpretation performed. Decision-making details documented in ED Course.  Risk Prescription drug management.   Lower abdominal pain with nausea onset at midnight.  Tachycardic.  No peritoneal signs but is tender.  hCG is negative.  Urinalysis does have  some blood but she did just finish her menses.  Denies any dysuria or hematuria.  Labs are reassuring.  Normal LFTs and lipase.  CT scan has been obtained to evaluate for appendicitis.  This shows normal appendix but does show some thickening of her bladder wall.  Denies any urinary symptoms. urinalysis not consistent with infection.  Will send culture.  Ultrasound pending at shift change for further evaluation of right lower quadrant pain.  She appears more comfortable than when she first arrived.  Consider passed kidney stone.  Urinalysis not consistent with cystitis but will send culture.  Dr. Bernard to assume care.  Anticipate discharge home with return precautions.        Final diagnoses:  Lower abdominal pain    ED Discharge Orders     None          Zimri Brennen, Garnette, MD 04/02/24 0710

## 2024-04-02 NOTE — ED Notes (Signed)
 Reviewed written d/c instructions with pt and all questions answered. Pt verbalized understanding. Pt left in stable condition with all belongings.

## 2024-04-03 LAB — URINE CULTURE: Culture: NO GROWTH
